# Patient Record
Sex: Female | Born: 1935 | Race: Black or African American | Hispanic: No | Marital: Single
Health system: Southern US, Community
[De-identification: ages and names within clinical notes are randomized; demographics above are authoritative.]

---

## 2020-01-17 ENCOUNTER — Encounter (HOSPITAL_BASED_OUTPATIENT_CLINIC_OR_DEPARTMENT_OTHER): Payer: Medicare Other | Admitting: Internal Medicine

## 2020-02-10 ENCOUNTER — Encounter (HOSPITAL_BASED_OUTPATIENT_CLINIC_OR_DEPARTMENT_OTHER): Payer: Medicare Other | Attending: Internal Medicine | Admitting: Internal Medicine

## 2020-02-10 DIAGNOSIS — E1151 Type 2 diabetes mellitus with diabetic peripheral angiopathy without gangrene: Secondary | ICD-10-CM | POA: Diagnosis not present

## 2020-02-10 DIAGNOSIS — E11621 Type 2 diabetes mellitus with foot ulcer: Secondary | ICD-10-CM | POA: Diagnosis not present

## 2020-02-10 DIAGNOSIS — E1122 Type 2 diabetes mellitus with diabetic chronic kidney disease: Secondary | ICD-10-CM | POA: Insufficient documentation

## 2020-02-10 DIAGNOSIS — N183 Chronic kidney disease, stage 3 unspecified: Secondary | ICD-10-CM | POA: Insufficient documentation

## 2020-02-10 DIAGNOSIS — L97423 Non-pressure chronic ulcer of left heel and midfoot with necrosis of muscle: Secondary | ICD-10-CM | POA: Diagnosis not present

## 2020-02-10 DIAGNOSIS — Z8249 Family history of ischemic heart disease and other diseases of the circulatory system: Secondary | ICD-10-CM | POA: Insufficient documentation

## 2020-02-10 DIAGNOSIS — I129 Hypertensive chronic kidney disease with stage 1 through stage 4 chronic kidney disease, or unspecified chronic kidney disease: Secondary | ICD-10-CM | POA: Diagnosis not present

## 2020-02-10 DIAGNOSIS — Z87891 Personal history of nicotine dependence: Secondary | ICD-10-CM | POA: Diagnosis not present

## 2020-02-10 NOTE — Progress Notes (Signed)
Rebecca, Walsh ([id number]) Visit Report for 02/10/2020 Abuse/Suicide Risk Screen Details Patient Name: Date of Service: Rebecca Walsh 02/10/2020 9:00 A M Medical Record Number: [id number] Patient Account Number: [id number] Date of Birth/Sex: Treating RN: November 19, 1925 (86 y.o. Female) Yevonne Pax Primary Care Rasha Ibe: Other Clinician: Referring Keiarra Charon: Treating Zaneta Lightcap/Extender: Cindee Lame in Treatment: 0 Abuse/Suicide Risk Screen Items Answer ABUSE RISK SCREEN: Has anyone close to you tried to hurt or harm you recentlyo No Do you feel uncomfortable with anyone in your familyo No Has anyone forced you do things that you didnt want to doo No Electronic Signature(s) Signed: 02/10/2020 4:51:09 PM By: Yevonne Pax RN Entered By: Yevonne Pax on 02/10/2020 09:39:46 -------------------------------------------------------------------------------- Activities of Daily Living Details Patient Name: Date of Service: Rebecca Walsh 02/10/2020 9:00 A M Medical Record Number: [id number] Patient Account Number: [id number] Date of Birth/Sex: Treating RN: [birth year] (86 y.o. Female) Yevonne Pax Primary Care Tamyah Cutbirth: Other Clinician: Referring Sherece Gambrill: Treating Kaydince Towles/Extender: Cindee Lame in Treatment: 0 Activities of Daily Living Items Answer Activities of Daily Living (Please select one for each item) Drive Automobile Not Able T Medications ake Not Able Use T elephone Need Assistance Care for Appearance Need Assistance Use T oilet Need Assistance Bath / Shower Need Assistance Dress Self Need Assistance Feed Self Need Assistance Walk Completely Able Get In / Out Bed Need Assistance Housework Not Able Prepare Meals Not Able Handle Money Not Able Shop for Self Not Able Electronic Signature(s) Signed: 02/10/2020 4:51:09 PM By: Yevonne Pax RN Entered By: Yevonne Pax on 02/10/2020  09:42:31 -------------------------------------------------------------------------------- Education Screening Details Patient Name: Date of Service: Rebecca Walsh 02/10/2020 9:00 A M Medical Record Number: [id number] Patient Account Number: [id number] Date of Birth/Sex: Treating RN: 29-Dec-1925 (86 y.o. Female) Yevonne Pax Primary Care Shelby Peltz: Other Clinician: Referring Tony Granquist: Treating Allien Melberg/Extender: Cindee Lame in Treatment: 0 Primary Learner Assessed: Patient Learning Preferences/Education Level/Primary Language Learning Preference: Explanation Highest Education Level: High School Preferred Language: English Cognitive Barrier Language Barrier: No Translator Needed: No Memory Deficit: No Emotional Barrier: No Cultural/Religious Beliefs Affecting Medical Care: No Physical Barrier Impaired Vision: No Impaired Hearing: No Decreased Hand dexterity: No Knowledge/Comprehension Knowledge Level: Medium Comprehension Level: High Ability to understand written instructions: High Ability to understand verbal instructions: High Motivation Anxiety Level: Anxious Cooperation: Cooperative Education Importance: Acknowledges Need Interest in Health Problems: Asks Questions Perception: Coherent Willingness to Engage in Self-Management High Activities: Readiness to Engage in Self-Management High Activities: Electronic Signature(s) Signed: 02/10/2020 4:51:09 PM By: Yevonne Pax RN Entered By: Yevonne Pax on 02/10/2020 09:45:32 -------------------------------------------------------------------------------- Fall Risk Assessment Details Patient Name: Date of Service: Rebecca Walsh 02/10/2020 9:00 A M Medical Record Number: [id number] Patient Account Number: [id number] Date of Birth/Sex: Treating RN: Mar 28, 1926 (86 y.o. Female) Epps, Carrie Primary Care Eldra Word: Other Clinician: Referring Waver Dibiasio: Treating Nyellie Yetter/Extender: Cindee Lame in Treatment: 0 Fall Risk Assessment Items Have you had 2 or more falls in the last 12 monthso 0 No Have you had any fall that resulted in injury in the last 12 monthso 0 No FALLS RISK SCREEN History of falling - immediate or within 3 months 0 No Secondary diagnosis (Do you have 2 or more medical diagnoseso) 0 No Ambulatory aid None/bed rest/wheelchair/nurse 0 No Crutches/cane/walker 0 No Furniture 0 No Intravenous therapy Access/Saline/Heparin Lock 0 No Gait/Transferring Normal/ bed rest/ wheelchair 0 No Weak (short steps with or without shuffle, stooped but able to lift head while walking,  may seek 0 No support from furniture) Impaired (short steps with shuffle, may have difficulty arising from chair, head down, impaired 0 No balance) Mental Status Oriented to own ability 0 No Electronic Signature(s) Signed: 02/10/2020 4:51:09 PM By: Yevonne Pax RN Entered By: Yevonne Pax on 02/10/2020 09:45:41 -------------------------------------------------------------------------------- Foot Assessment Details Patient Name: Date of Service: Rebecca Walsh 02/10/2020 9:00 A M Medical Record Number: [id number] Patient Account Number: [id number] Date of Birth/Sex: Treating RN: 11-Feb-1926 (86 y.o. Female) Yevonne Pax Primary Care Edison Wollschlager: Other Clinician: Referring Ky Rumple: Treating Kianni Lheureux/Extender: Cindee Lame in Treatment: 0 Foot Assessment Items Site Locations + = Sensation present, - = Sensation absent, C = Callus, U = Ulcer R = Redness, W = Warmth, M = Maceration, PU = Pre-ulcerative lesion F = Fissure, S = Swelling, D = Dryness Assessment Right: Left: Other Deformity: No No Prior Foot Ulcer: No No Prior Amputation: No No Charcot Joint: No No Ambulatory Status: Ambulatory Without Help Gait: Unsteady Electronic Signature(s) Signed: 02/10/2020 4:51:09 PM By: Yevonne Pax RN Entered By: Yevonne Pax on 02/10/2020  09:51:32 -------------------------------------------------------------------------------- Nutrition Risk Screening Details Patient Name: Date of Service: Rebecca Walsh 02/10/2020 9:00 A M Medical Record Number: [id number] Patient Account Number: [id number] Date of Birth/Sex: Treating RN: [birth year] (86 y.o. Female) Epps, Carrie Primary Care Massie Cogliano: Other Clinician: Referring Istvan Behar: Treating Harish Bram/Extender: Cindee Lame in Treatment: 0 Height (in): 66 Weight (lbs): 140 Body Mass Index (BMI): 22.6 Nutrition Risk Screening Items Score Screening NUTRITION RISK SCREEN: I have an illness or condition that made me change the kind and/or amount of food I eat 0 No I eat fewer than two meals per day 0 No I eat few fruits and vegetables, or milk products 0 No I have three or more drinks of beer, liquor or wine almost every day 0 No I have tooth or mouth problems that make it hard for me to eat 0 No I don't always have enough money to buy the food I need 0 No I eat alone most of the time 0 No I take three or more different prescribed or over-the-counter drugs a day 1 Yes Without wanting to, I have lost or gained 10 pounds in the last six months 0 No I am not always physically able to shop, cook and/or feed myself 2 Yes Nutrition Protocols Good Risk Protocol Moderate Risk Protocol 0 Provide education on nutrition High Risk Proctocol Risk Level: Moderate Risk Score: 3 Electronic Signature(s) Signed: 02/10/2020 4:51:09 PM By: Yevonne Pax RN Entered By: Yevonne Pax on 02/10/2020 09:45:58

## 2020-02-12 NOTE — Progress Notes (Signed)
Rebecca Walsh, Rebecca Walsh ([id number]) Visit Report for 02/10/2020 Chief Complaint Document Details Patient Name: Date of Service: [place] Rebecca Walsh, CEO [place] 02/10/2020 9:00 A M Medical Record Number: [id number] Patient Account Number: [id number] Date of Birth/Sex: Treating RN: 13-Sep-1925 (86 y.o. Female) Primary Care Provider: Other Clinician: Referring Provider: Treating Provider/Extender: Posey Prontoobson, Ronnell Makarewicz Walsh, Rebecca CrateEmily Walsh in Treatment: 0 Information Obtained from: Patient Chief Complaint 02/10/2020; this patient arrives accompanied by her son and daughter-in-law for review of wounds on the left calcaneus Electronic Signature(s) Signed: 02/12/2020 6:30:39 AM By: Rebecca Najjarobson, Tywanda Rice MD Entered By: Rebecca Najjarobson, Rebecca Walsh on 02/10/2020 10:52:33 -------------------------------------------------------------------------------- Debridement Details Patient Name: Date of Service: [place] Rebecca AmassRTIN, CEO [place] 02/10/2020 9:00 A M Medical Record Number: [id number] Patient Account Number: [id number] Date of Birth/Sex: Treating RN: 13-Sep-1925 (86 y.o. Female) Primary Care Provider: Other Clinician: Referring Provider: Treating Provider/Extender: Rebecca Walsh, Rebecca Walsh Walsh, Rebecca Walsh in Treatment: 0 Debridement Performed for Assessment: Wound #1 Left Calcaneus Performed By: Physician Rebecca Walsh, Rebecca Purdon G., MD Debridement Type: Debridement Severity of Tissue Pre Debridement: Fat layer exposed Level of Consciousness (Pre-procedure): Awake and Alert Pre-procedure Verification/Time Out Yes - 10:22 Taken: Start Time: 10:22 Pain Control: Other : benzocaine, 20% T Area Debrided (L x W): otal 2.5 (cm) x 3.2 (cm) = 8 (cm) Tissue and other material debrided: Muscle, Slough, Subcutaneous, Slough Level: Skin/Subcutaneous Tissue/Muscle Debridement Description: Excisional Instrument: Curette Bleeding: Moderate Hemostasis Achieved: Pressure End Time: 10:23 Procedural Pain: 0 Post Procedural Pain: 0 Response to Treatment: Procedure was tolerated  well Level of Consciousness (Post- Awake and Alert procedure): Post Debridement Measurements of Total Wound Length: (cm) 2.5 Width: (cm) 3.2 Depth: (cm) 1.2 Volume: (cm) 7.54 Character of Wound/Ulcer Post Debridement: Requires Further Debridement Severity of Tissue Post Debridement: Fat layer exposed Post Procedure Diagnosis Same as Pre-procedure Electronic Signature(s) Signed: 02/12/2020 6:30:39 AM By: Rebecca Najjarobson, Rebecca Frampton MD Entered By: Rebecca Najjarobson, Rebecca Walsh on 02/10/2020 10:52:02 -------------------------------------------------------------------------------- HPI Details Patient Name: Date of Service: [place] Rebecca AmassRTIN, CEO [place] 02/10/2020 9:00 A M Medical Record Number: [id number] Patient Account Number: [id number] Date of Birth/Sex: Treating RN: 13-Sep-1925 (86 y.o. Female) Primary Care Provider: Other Clinician: Referring Provider: Treating Provider/Extender: Rebecca Walsh, Rebecca Walsh, Rebecca Walsh in Treatment: 0 History of Present Illness HPI Description: ADMISSION 02/10/2020 This is a 86 year old woman who lives in [facility] which is an assisted living [place]. She has home health which is surprising since she has [place] healthcare Medicaid. Nevertheless she has had 4767-month history of a substantial wound on the left heel. This is in the setting of type 2 diabetes. From what I can tell they have been using Santyl on the wound bed with [facility] changing the dressing 3 times a week. She has also developed more superficial areas on the posterior and lateral part of the heel. Apparently remotely the patient had multiple toes removed on both feet secondary to infection. She is up walking active. I could see a culture [facility] on [facility]/16 that showed 2+ Proteus she apparently has received a course of Keflex. They are using a shoe for her with the heel cut out in the back which is appropriate for this reason I did not see the need to give her a heel offloading shoe. Past medical  history; type 2 diabetes, parotid mass, carotid artery stenosis, pressure ulcer of the left heel, hypertension, chronic renal failure stage III We could not obtain a Doppler signal in her foot therefore we could not measure ABIs at the dorsalis pedis or posterior tibial on the left Electronic Signature(s) Signed: 02/12/2020  6:30:39 AM By: Rebecca Najjar MD Entered By: Rebecca Walsh on 02/10/2020 10:55:01 -------------------------------------------------------------------------------- Physical Exam Details Patient Name: Date of Service: [name] [place] 02/10/2020 9:00 A M Medical Record Number: [id number] Patient Account Number: [id number] Date of Birth/Sex: Treating RN: [birth year] (86 y.o. Female) Primary Care Provider: Other Clinician: Referring Provider: Treating Provider/Extender: Rebecca Rebecca Walsh in Treatment: 0 Constitutional Sitting or standing Blood Pressure is within target range for patient.. Pulse regular and within target range for patient.Marland Kitchen Respirations regular, non-labored and within target range.. Temperature is normal and within the target range for the patient.Marland Kitchen Appears in no distress. Cardiovascular Could not feel femoral or popliteal pulses on the left. I could feel a faint posterior tibial pulse on the left but not the right. No dorsalis pedis pulses palpable. Musculoskeletal The patient has had multiple toes missing including the fifth first and second toes on the left. There are multiple toes missing on the right as well. Psychiatric Under about cognition. No overt depression. Notes Wound exam; the major areas on the medial part of the left heel. Deep punched out area with a completely necrotic surface. Using a #5 curette aggressive debridement dipping down into the muscle layer of the heel. I was able to get the healthy red bleeding tissue but a considerable amount of debridement was done. There is no evidence of surrounding infection the wound did  not probe to bone Posteriorly and laterally more superficial areas with simple loss of skin and Electronic Signature(s) Signed: 02/12/2020 6:30:39 AM By: Rebecca Najjar MD Entered By: Rebecca Walsh on 02/10/2020 10:58:33 -------------------------------------------------------------------------------- Physician Orders Details Patient Name: Date of Service: [name] [place] 02/10/2020 9:00 A M Medical Record Number: [id number] Patient Account Number: [id number] Date of Birth/Sex: Treating RN: [birth year] (86 y.o. Female) Rebecca Walsh Primary Care Provider: Other Clinician: Referring Provider: Treating Provider/Extender: Rebecca Rebecca Walsh in Treatment: 0 Verbal / Phone Orders: No Diagnosis Coding Follow-up Appointments Return Appointment in 2 Walsh. Dressing Change Frequency Wound #1 Left Calcaneus Change Dressing every other day. Wound #2 Left,Lateral Calcaneus Change Dressing every other day. Wound Cleansing Wound #1 Left Calcaneus Clean wound with Wound Cleanser Wound #2 Left,Lateral Calcaneus Clean wound with Wound Cleanser Primary Wound Dressing Wound #1 Left Calcaneus Iodoflex Wound #2 Left,Lateral Calcaneus Iodoflex Secondary Dressing Wound #1 Left Calcaneus Kerlix/Rolled Gauze Dry Gauze Heel Cup Wound #2 Left,Lateral Calcaneus Kerlix/Rolled Gauze Dry Gauze Heel Cup Home Health Continue Home Health skilled nursing for wound care. - [facility] rterial Studies- Bilateral - Bilateral, Including ABI's and TBI's, non-healing wound to left heel [facility]) A Electronic Signature(s) Signed: 02/10/2020 5:26:06 PM By: Rebecca Walsh Signed: 02/12/2020 6:30:39 AM By: Rebecca Najjar MD Entered By: Rebecca Walsh on 02/10/2020 10:28:12 Prescription 02/10/2020 -------------------------------------------------------------------------------- Candi Leash MD Patient Name: Provider: 1925-08-09  [id number] Date of Birth: NPI#: Female [id number] Sex: DEA #: [id number] Phone #: License #: [facility] Patient Address: [address] [place] [address] [place], [name] [address] [place], [name] [address] [phone] Allergies No Known Allergies Provider's Orders rterial Studies- Bilateral - Bilateral, Including ABI's and TBI's, non-healing wound to left heel [facility]) A Hand Signature: Date(s): Electronic Signature(s) Signed: 02/10/2020 5:26:06 PM By: Rebecca Walsh Signed: 02/12/2020 6:30:39 AM By: Rebecca Najjar MD Entered By: Rebecca Walsh on 02/10/2020 10:28:13 -------------------------------------------------------------------------------- Problem List Details Patient Name: Date of Service: [name] [place] 02/10/2020 9:00 A M Medical Record  Number: [id number] Patient Account Number: [id number] Date of Birth/Sex: Treating RN: 11-02-1925 (86 y.o. 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 02/10/2020 No Yes L97.421 Non-pressure chronic ulcer of left heel and midfoot limited to breakdown of 02/10/2020 No Yes skin E11.51 Type 2 diabetes mellitus with diabetic peripheral angiopathy without gangrene 02/10/2020 No Yes E11.40 Type 2 diabetes mellitus with diabetic neuropathy, unspecified 02/10/2020 No Yes Inactive Problems ICD-10 Code Description Active Date Inactive Date L97.123 Non-pressure chronic ulcer of left thigh with necrosis of muscle 02/10/2020 02/10/2020 Resolved Problems Electronic Signature(s) Signed: 02/12/2020 6:30:39 AM By: Rebecca Najjar MD Entered  By: Rebecca Walsh on 02/10/2020 10:40:20 -------------------------------------------------------------------------------- Progress Note Details Patient Name: Date of Service: [name] [place] 02/10/2020 9:00 A M Medical Record Number: [id number] Patient Account Number: [id number] Date of Birth/Sex: Treating RN: 31-Aug-1925 (86 y.o. Female) Primary Care Provider: Other Clinician: Referring Provider: Treating Provider/Extender: Posey Pronto, Rebecca Walsh in Treatment: 0 Subjective Chief Complaint Information obtained from Patient 02/10/2020; this patient arrives accompanied by her son and daughter-in-law for review of wounds on the left calcaneus History of Present Illness (HPI) ADMISSION 02/10/2020 This is a 86 year old woman who lives in [facility] which is an assisted living in [place]. She has home health which is surprising since she has [place] healthcare Medicaid. Nevertheless she has had 9-month history of a substantial wound on the left heel. This is in the setting of type 2 diabetes. From what I can tell they have been using Santyl on the wound bed [facility] changing the dressing 3 times a week. She has also developed more superficial areas on the posterior and lateral part of the heel. Apparently remotely the patient had multiple toes removed on both feet secondary to infection. She is up walking active. I could see a culture from [facility] on 9/16 that showed 2+ Proteus she apparently has received a course of Keflex. They are using a shoe for her with the heel cut out in the back which is appropriate for this reason I did not see the need to give her a heel offloading shoe. Past medical history; type 2 diabetes, parotid mass, carotid artery stenosis, pressure ulcer of the left heel, hypertension, chronic renal failure stage III We could not obtain a Doppler signal in her foot therefore we could not measure ABIs at the dorsalis pedis or  posterior tibial on the left Patient History Information obtained from Patient. Allergies No Known Allergies Family History Hypertension - Child, No family history of Cancer, Diabetes, Heart Disease, Hereditary Spherocytosis, Kidney Disease, Seizures, Stroke, Thyroid Problems, Tuberculosis. Social History Former smoker, Marital Status - Widowed, Alcohol Use - Never, Drug Use - No History, Caffeine Use - Daily. Medical History Eyes Denies history of Cataracts, Glaucoma, Optic Neuritis Ear/Nose/Mouth/Throat Denies history of Chronic sinus problems/congestion, Middle ear problems Hematologic/Lymphatic Denies history of Anemia, Hemophilia, Human Immunodeficiency Virus, Lymphedema, Sickle Cell Disease Respiratory Denies history of Aspiration, Asthma, Chronic Obstructive Pulmonary Disease (COPD), Pneumothorax, Sleep Apnea, Tuberculosis Cardiovascular Patient has history of Hypertension Denies history of Angina, Arrhythmia, Congestive Heart Failure, Coronary Artery Disease, Deep Vein Thrombosis, Hypotension, Myocardial Infarction, Peripheral Arterial Disease, Peripheral Venous Disease, Phlebitis, Vasculitis Gastrointestinal Denies history of Cirrhosis , Colitis, Crohnoos, Hepatitis A, Hepatitis  B, Hepatitis C Endocrine Patient has history of Type II Diabetes Denies history of Type I Diabetes Genitourinary Denies history of End Stage Renal Disease Immunological Denies history of Lupus Erythematosus, Raynaudoos, Scleroderma Integumentary (Skin) Denies history of History of Burn Musculoskeletal Denies history of Gout, Rheumatoid Arthritis, Osteoarthritis, Osteomyelitis Neurologic Denies history of Dementia, Neuropathy, Quadriplegia, Paraplegia, Seizure Disorder Oncologic Denies history of Received Chemotherapy, Received Radiation Psychiatric Denies history of Anorexia/bulimia, Confinement Anxiety Patient is treated with Controlled Diet. Blood sugar is not tested. Review of Systems  (ROS) Constitutional Symptoms (General Health) Denies complaints or symptoms of Fatigue, Fever, Chills, Marked Weight Change. Eyes Denies complaints or symptoms of Dry Eyes, Vision Changes, Glasses / Contacts. Ear/Nose/Mouth/Throat Denies complaints or symptoms of Chronic sinus problems or rhinitis. Respiratory Denies complaints or symptoms of Chronic or frequent coughs, Shortness of Breath. Cardiovascular Denies complaints or symptoms of Chest pain. Gastrointestinal Denies complaints or symptoms of Frequent diarrhea, Nausea, Vomiting. Endocrine Denies complaints or symptoms of Heat/cold intolerance. Genitourinary Denies complaints or symptoms of Frequent urination. Integumentary (Skin) Complains or has symptoms of Wounds. Musculoskeletal Denies complaints or symptoms of Muscle Pain, Muscle Weakness. Neurologic Denies complaints or symptoms of Numbness/parasthesias. Psychiatric Denies complaints or symptoms of Claustrophobia, Suicidal. Objective Constitutional Sitting or standing Blood Pressure is within target range for patient.. Pulse regular and within target range for patient.Marland Kitchen Respirations regular, non-labored and within target range.. Temperature is normal and within the target range for the patient.Marland Kitchen Appears in no distress. Vitals Time Taken: 9:29 AM, Height: 66 in, Source: Stated, Weight: 140 lbs, Source: Stated, BMI: 22.6, Temperature: 98.6 F, Pulse: 80 bpm, Respiratory Rate: 18 breaths/min, Blood Pressure: 132/71 mmHg. Cardiovascular Could not feel femoral or popliteal pulses on the left. I could feel a faint posterior tibial pulse on the left but not the right. No dorsalis pedis pulses palpable. Musculoskeletal The patient has had multiple toes missing including the fifth first and second toes on the left. There are multiple toes missing on the right as well. Psychiatric Under about cognition. No overt depression. General Notes: Wound exam; the major areas on the  medial part of the left heel. Deep punched out area with a completely necrotic surface. Using a #5 curette aggressive debridement dipping down into the muscle layer of the heel. I was able to get the healthy red bleeding tissue but a considerable amount of debridement was done. There is no evidence of surrounding infection the wound did not probe to bone ooPosteriorly and laterally more superficial areas with simple loss of skin and Integumentary (Hair, Skin) Wound #1 status is Open. Original cause of wound was Gradually Appeared. The wound is located on the Left Calcaneus. The wound measures 2.5cm length x 3.2cm width x 1.2cm depth; 6.283cm^2 area and 7.54cm^3 volume. There is Fat Layer (Subcutaneous Tissue) exposed. There is no tunneling or undermining noted. There is a medium amount of serosanguineous drainage noted. There is no granulation within the wound bed. There is a large (67-100%) amount of necrotic tissue within the wound bed including Adherent Slough. Wound #2 status is Open. Original cause of wound was Gradually Appeared. The wound is located on the Left,Lateral Calcaneus. The wound measures 1.2cm length x 1.3cm width x 0.1cm depth; 1.225cm^2 area and 0.123cm^3 volume. There is Fat Layer (Subcutaneous Tissue) exposed. There is no tunneling or undermining noted. There is a medium amount of serosanguineous drainage noted. There is small (1-33%) pink granulation within the wound bed. There is a large (67-100%) amount of necrotic tissue within the  wound bed including Adherent Slough. 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 There was a Excisional Skin/Subcutaneous Tissue/Muscle Debridement with a total area of 8 sq cm performed by Rebecca Caul., MD. With the following instrument(s): Curette Material removed includes Muscle, Subcutaneous Tissue, and Slough after achieving pain control using Other (benzocaine, 20%). No specimens were taken. A time out was conducted at 10:22, prior to the start of the procedure. A Moderate amount of bleeding was controlled with Pressure. The procedure was tolerated well with a pain level of 0 throughout and a pain level of 0 following the procedure. Post Debridement Measurements: 2.5cm length x 3.2cm width x 1.2cm depth; 7.54cm^3 volume. Character of Wound/Ulcer Post Debridement requires further debridement. Severity of Tissue Post Debridement is: Fat layer exposed. Post procedure Diagnosis Wound #1: Same as Pre-Procedure Plan Follow-up Appointments: Return Appointment in 2 Walsh. Dressing Change Frequency: Wound #1 Left Calcaneus: Change Dressing every other day. Wound #2 Left,Lateral Calcaneus: Change Dressing every other day. Wound Cleansing: Wound #1 Left Calcaneus: Clean wound with Wound Cleanser Wound #2 Left,Lateral Calcaneus: Clean wound with Wound Cleanser Primary Wound Dressing: Wound #1 Left Calcaneus: Iodoflex Wound #2 Left,Lateral Calcaneus: Iodoflex Secondary Dressing: Wound #1 Left Calcaneus: Kerlix/Rolled Gauze Dry Gauze Heel Cup Wound #2 Left,Lateral Calcaneus: Kerlix/Rolled Gauze Dry Gauze Heel Cup Home Health: Continue Home Health skilled nursing for wound care. - [facility] ordered were: Arterial Studies- Bilateral - Bilateral, Including ABI's and TBI's, non-healing wound to left heel [facility]) 1. Santyl is a dressing that should be changed every day not 3  times a week. I therefore change the dressing on the heel to Iodoflex. This can be changed 3 times a week 2. We will use a heel cup I do not think the superficial areas that she has need a more specific dressing 3. I think this woman probably has significant PAD. I have ordered arterial studies not just for healing this wound but for determination of issues going forward. 4. She has diabetic peripheral neuropathy or I think she would be in more pain than she appears to be in which is really not a lot 5. 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 I am not sure what the issue was but they were apparently done remotely a more than 10 years ago I spent 35 minutes in review of this patient's past medical history, face-to-face evaluation and preparation of this record Electronic Signature(s) Signed: 02/12/2020 6:30:39 AM By: Rebecca Najjar MD Entered By: Rebecca Walsh on 02/10/2020 11:00:57 -------------------------------------------------------------------------------- HxROS Details Patient Name: Date of Service: [name] [place] 02/10/2020 9:00 A M Medical Record Number: [id number] Patient Account Number: [id number] Date of Birth/Sex: Treating RN: 1925-08-30 (86 y.o. Female) Rebecca Walsh Primary Care Provider: Other Clinician: Referring Provider: Treating Provider/Extender: Rebecca Rebecca Walsh in Treatment: 0 Information Obtained From Patient Constitutional Symptoms (General Health) Complaints and Symptoms: Negative for: Fatigue; Fever; Chills; Marked Weight Change Eyes Complaints and Symptoms: Negative for: Dry Eyes; Vision Changes; Glasses / Contacts Medical History:  Negative for: Cataracts; Glaucoma; Optic Neuritis Ear/Nose/Mouth/Throat Complaints and Symptoms: Negative for: Chronic sinus problems or rhinitis Medical History: Negative for: Chronic sinus problems/congestion; Middle ear problems Respiratory Complaints and Symptoms: Negative for:  Chronic or frequent coughs; Shortness of Breath Medical History: Negative for: Aspiration; Asthma; Chronic Obstructive Pulmonary Disease (COPD); Pneumothorax; Sleep Apnea; Tuberculosis Cardiovascular Complaints and Symptoms: Negative for: Chest pain Medical History: Positive for: Hypertension Negative for: Angina; Arrhythmia; Congestive Heart Failure; Coronary Artery Disease; Deep Vein Thrombosis; Hypotension; Myocardial Infarction; Peripheral Arterial Disease; Peripheral Venous Disease; Phlebitis; Vasculitis Gastrointestinal Complaints and Symptoms: Negative for: Frequent diarrhea; Nausea; Vomiting Medical History: Negative for: Cirrhosis ; Colitis; Crohns; Hepatitis A; Hepatitis B; Hepatitis C Endocrine Complaints and Symptoms: Negative for: Heat/cold intolerance Medical History: Positive for: Type II Diabetes Negative for: Type I Diabetes Treated with: Diet Blood sugar tested every day: No Genitourinary Complaints and Symptoms: Negative for: Frequent urination Medical History: Negative for: End Stage Renal Disease Integumentary (Skin) Complaints and Symptoms: Positive for: Wounds Medical History: Negative for: History of Burn Musculoskeletal Complaints and Symptoms: Negative for: Muscle Pain; Muscle Weakness Medical History: Negative for: Gout; Rheumatoid Arthritis; Osteoarthritis; Osteomyelitis Neurologic Complaints and Symptoms: Negative for: Numbness/parasthesias Medical History: Negative for: Dementia; Neuropathy; Quadriplegia; Paraplegia; Seizure Disorder Psychiatric Complaints and Symptoms: Negative for: Claustrophobia; Suicidal Medical History: Negative for: Anorexia/bulimia; Confinement Anxiety Hematologic/Lymphatic Medical History: Negative for: Anemia; Hemophilia; Human Immunodeficiency Virus; Lymphedema; Sickle Cell Disease Immunological Medical History: Negative for: Lupus Erythematosus; Raynauds; Scleroderma Oncologic Medical History: Negative  for: Received Chemotherapy; Received Radiation Immunizations Pneumococcal Vaccine: Received Pneumococcal Vaccination: No Implantable Devices None Family and Social History Cancer: No; Diabetes: No; Heart Disease: No; Hereditary Spherocytosis: No; Hypertension: Yes - Child; Kidney Disease: No; Seizures: No; Stroke: No; Thyroid Problems: No; Tuberculosis: No; Former smoker; Marital Status - Widowed; Alcohol Use: Never; Drug Use: No History; Caffeine Use: Daily; Financial Concerns: No; Food, Clothing or Shelter Needs: No; Support System Lacking: No; Transportation Concerns: No Electronic Signature(s) Signed: 02/10/2020 4:51:09 PM By: Rebecca Pax RN Signed: 02/12/2020 6:30:39 AM By: Rebecca Najjar MD Entered By: Rebecca Walsh on 02/10/2020 09:39:21 -------------------------------------------------------------------------------- SuperBill Details Patient Name: Date of Service: MA Rebecca Balo 02/10/2020 Medical Record Number: [id number] Patient Account Number: [id number] Date of Birth/Sex: Treating RN: [birth year] (86 y.o. 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 L97.421 Non-pressure chronic ulcer of left heel and midfoot limited to breakdown of skin E11.51 Type 2 diabetes mellitus with diabetic peripheral angiopathy without gangrene E11.40 Type 2 diabetes mellitus with diabetic neuropathy, unspecified Facility Procedures CPT4 Code: 40981191 Description: 99213 - WOUND CARE VISIT-LEV 3 EST PT Modifier: 25 Quantity: 1 CPT4 Code: 47829562 Description: 11043 - DEB MUSC/FASCIA 20 SQ CM/< ICD-10 Diagnosis Description L97.423 Non-pressure chronic ulcer of left heel and midfoot with necrosis of  muscle Modifier: Quantity: 1 Physician Procedures : CPT4 Code Description Modifier [id number] WC PHYS LEVEL 3 NEW PT 25 ICD-10 Diagnosis Description E11.621 Type 2 diabetes mellitus with foot ulcer 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

## 2020-02-12 NOTE — Progress Notes (Signed)
Rebecca, Walsh ([id number]) Visit Report for 02/10/2020 Allergy List Details Patient Name: Date of Service: Rebecca Walsh 02/10/2020 9:00 A M Medical Record Number: [id number] Patient Account Number: [id number] Date of Birth/Sex: Treating RN: 06-26-1925 (86 y.o. Female) Rebecca Walsh Primary Care Sherwin Hollingshed: Other Clinician: Referring Ky Moskowitz: Treating Abbegale Stehle/Extender: Cindee Lame in Treatment: 0 Allergies Active Allergies No Known Allergies Allergy Notes Electronic Signature(s) Signed: 02/10/2020 4:51:09 PM By: Rebecca Pax RN Entered By: Rebecca Walsh on 02/10/2020 09:32:36 -------------------------------------------------------------------------------- Arrival Information Details Patient Name: Date of Service: [name] 02/10/2020 9:00 A M Medical Record Number: [id number] Patient Account Number: [id number] Date of Birth/Sex: Treating RN: 02-18-1926 (86 y.o. Female) Rebecca Walsh Primary Care Breckon Reeves: Other Clinician: Referring Nkechi Linehan: Treating Ricke Kimoto/Extender: Cindee Lame in Treatment: 0 Visit Information Patient Arrived: Cloyde Reams Time: 09:17 Accompanied By: family friend Transfer Assistance: None Patient Identification Verified: Yes Secondary Verification Process Completed: Yes Patient Requires Transmission-Based Precautions: No Patient Has Alerts: No Electronic Signature(s) Signed: 02/10/2020 4:51:09 PM By: Rebecca Pax RN Entered By: Rebecca Walsh on 02/10/2020 09:29:24 -------------------------------------------------------------------------------- Clinic Level of Care Assessment Details Patient Name: Date of Service: [name] 02/10/2020 9:00 A M Medical Record Number: [id number] Patient Account Number: [id number] Date of Birth/Sex: Treating RN: [birth year] (86 y.o. Female) Dwiggins, Rebecca Walsh Primary Care Tameika Heckmann: Other Clinician: Referring Copeland Lapier: Treating Lakeem Rozo/Extender: Cindee Lame in Treatment: 0 Clinic Level of Care Assessment Items TOOL 1 Quantity Score X- 1 0 Use when EandM and Procedure is performed on INITIAL visit ASSESSMENTS - Nursing Assessment / Reassessment X- 1 20 General Physical Exam (combine w/ comprehensive assessment (listed just below) when performed on new pt. evals) X- 1 25 Comprehensive Assessment (HX, ROS, Risk Assessments, Wounds Hx, etc.) ASSESSMENTS - Wound and Skin Assessment / Reassessment []  - 0 Dermatologic / Skin Assessment (not related to wound area) ASSESSMENTS - Ostomy and/or Continence Assessment and Care []  - 0 Incontinence Assessment and Management []  - 0 Ostomy Care Assessment and Management (repouching, etc.) PROCESS - Coordination of Care X - Simple Patient / Family Education for ongoing care 1 15 []  - 0 Complex (extensive) Patient / Family Education for ongoing care X- 1 10 Staff obtains , Records, T Results / Process Orders est X- 1 10 Staff telephones HHA, Nursing Homes / Clarify orders / etc []  - 0 Routine Transfer to another Facility (non-emergent condition) []  - 0 Routine Hospital Admission (non-emergent condition) X- 1 15 New Admissions / / Ordering NPWT Apligraf, etc. , []  - 0 Emergency Hospital Admission (emergent condition) PROCESS - Special Needs []  - 0 Pediatric / Minor Patient Management []  - 0 Isolation Patient Management []  - 0 Hearing / Language / Visual special needs []  - 0 Assessment of Community assistance (transportation, D/C planning, etc.) []  - 0 Additional assistance / Altered mentation []  - 0 Support Surface(s) Assessment (bed, cushion, seat, etc.) INTERVENTIONS - Miscellaneous []  - 0 External ear exam []  - 0 Patient Transfer (multiple staff / / Similar devices) []  - 0 Simple Staple / Suture removal (25 or less) []  - 0 Complex Staple / Suture removal (26 or more) []  - 0 Hypo/Hyperglycemic Management (do not check if  billed separately) X- 1 15 Ankle / Brachial Index (ABI) - do not check if billed separately Has the patient been seen at the hospital within the last three years: Yes Total Score: 110 Level Of Care: New/Established - Level 3 Electronic Signature(s) Signed: 02/10/2020  5:26:06 PM By: Cherylin Mylar Entered By: Cherylin Mylar on 02/10/2020 10:36:23 -------------------------------------------------------------------------------- Encounter Discharge Information Details Patient Name: Date of Service: [name] 02/10/2020 9:00 A M Medical Record Number: [id number] Patient Account Number: [id number] Date of Birth/Sex: Treating RN: 1925/08/25 (86 y.o. Female) Rebecca Walsh Primary Care Curren Mohrmann: Other Clinician: Referring Shawny Borkowski: Treating Nikiyah Fackler/Extender: Cindee Lame in Treatment: 0 Encounter Discharge Information Items Post Procedure Vitals Discharge Condition: Stable Temperature (F): 98.6 Ambulatory Status: Walker Pulse (bpm): 80 Discharge Destination: Other (Note Required) Respiratory Rate (breaths/min): 18 Telephoned: No Blood Pressure (mmHg): 132/71 Orders Sent: Yes Transportation: Private Auto Accompanied By: friend Schedule Follow-up Appointment: Yes Clinical Summary of Care: Patient Declined Notes assisted living facility Electronic Signature(s) Signed: 02/10/2020 4:39:15 PM By: Rebecca Deed RN, BSN Entered By: Rebecca Walsh on 02/10/2020 10:55:54 -------------------------------------------------------------------------------- Lower Extremity Assessment Details Patient Name: Date of Service: [name] 02/10/2020 9:00 A M Medical Record Number: [id number] Patient Account Number: [id number] Date of Birth/Sex: Treating RN: [birth year] (86 y.o. Female) Rebecca Walsh Primary Care Jarrah Seher: Other Clinician: Referring Francee Setzer: Treating Jamil Armwood/Extender: Cindee Lame in Treatment: 0 Edema  Assessment Assessed: [name]: No] [Right: No] [Left: Edema] [Right: :] Calf Left: Right: Point of Measurement: 40 cm From Medial Instep 31 cm cm Ankle Left: Right: Point of Measurement: 10 cm From Medial Instep 24 cm cm Electronic Signature(s) Signed: 02/10/2020 4:51:09 PM By: Rebecca Pax RN Entered By: Rebecca Walsh on 02/10/2020 10:06:46 -------------------------------------------------------------------------------- Multi Wound Chart Details Patient Name: Date of Service: [name] 02/10/2020 9:00 A M Medical Record Number: [id number] Patient Account Number: [id number] Date of Birth/Sex: Treating RN: Aug 31, 1925 (86 y.o. Female) Primary Care Mareo Portilla: Other Clinician: Referring Camri Molloy: Treating Denali Becvar/Extender: Cindee Lame in Treatment: 0 Vital Signs Height(in): 66 Pulse(bpm): 80 Weight(lbs): 140 Blood Pressure(mmHg): 132/71 Body Mass Index(BMI): 23 Temperature(F): 98.6 Respiratory Rate(breaths/min): 18 Photos: [1:No Photos Left Calcaneus] [2:No Photos Left, Lateral Calcaneus] [N/A:N/A N/A] Wound Location: [1:Gradually Appeared] [2:Gradually Appeared] [N/A:N/A] Wounding Event: [1:Diabetic Wound/Ulcer of the Lower] [2:Diabetic Wound/Ulcer of the Lower] [N/A:N/A] Primary Etiology: [1:Extremity Hypertension, Type II Diabetes] [2:Extremity Hypertension, Type II Diabetes] [N/A:N/A] Comorbid History: [1:10/18/2019] [2:10/18/2019] [N/A:N/A] Date Acquired: [1:0] [2:0] [N/A:N/A] Weeks of Treatment: [1:Open] [2:Open] [N/A:N/A] Wound Status: [1:2.5x3.2x1.2] [2:1.2x1.3x0.1] [N/A:N/A] Measurements L x W x D (cm) [1:6.283] [2:1.225] [N/A:N/A] A (cm) : rea [1:7.54] [2:0.123] [N/A:N/A] Volume (cm) : [1:Grade 2] [2:Grade 2] [N/A:N/A] Classification: [1:Medium] [2:Medium] [N/A:N/A] Exudate A mount: [1:Serosanguineous] [2:Serosanguineous] [N/A:N/A] Exudate Type: [1:red, brown] [2:red, brown] [N/A:N/A] Exudate Color: [1:None Present (0%)] [2:Small (1-33%)]  [N/A:N/A] Granulation A mount: [1:N/A] [2:Pink] [N/A:N/A] Granulation Quality: [1:Large (67-100%)] [2:Large (67-100%)] [N/A:N/A] Necrotic A mount: [1:Fat Layer (Subcutaneous Tissue): Yes Fat Layer (Subcutaneous Tissue): Yes N/A] Exposed Structures: [1:Fascia: No Tendon: No Muscle: No Joint: No Bone: No None] [2:Fascia: No Tendon: No Muscle: No Joint: No Bone: No None] [N/A:N/A] Epithelialization: [1:Debridement - Excisional] [2:N/A] [N/A:N/A] Debridement: Pre-procedure Verification/Time Out 10:22 [2:N/A] [N/A:N/A] Taken: [1:Other] [2:N/A] [N/A:N/A] Pain Control: [1:Subcutaneous, Slough] [2:N/A] [N/A:N/A] Tissue Debrided: [1:Skin/Subcutaneous Tissue] [2:N/A] [N/A:N/A] Level: [1:8] [2:N/A] [N/A:N/A] Debridement A (sq cm): [1:rea Curette] [2:N/A] [N/A:N/A] Instrument: [1:Moderate] [2:N/A] [N/A:N/A] Bleeding: [1:Pressure] [2:N/A] [N/A:N/A] Hemostasis A chieved: [1:0] [2:N/A] [N/A:N/A] Procedural Pain: [1:0] [2:N/A] [N/A:N/A] Post Procedural Pain: [1:Procedure was tolerated well] [2:N/A] [N/A:N/A] Debridement Treatment Response: [1:2.5x3.2x1.2] [2:N/A] [N/A:N/A] Post Debridement Measurements L x W x D (cm) [1:7.54] [2:N/A] [N/A:N/A] Post Debridement Volume: (cm) [1:Debridement] [2:N/A] [N/A:N/A] Treatment Notes Electronic Signature(s) Signed: 02/12/2020 6:30:39 AM By: Baltazar Najjar MD Entered  By: Baltazar Najjar on 02/10/2020 10:51:39 -------------------------------------------------------------------------------- Multi-Disciplinary Care Plan Details Patient Name: Date of Service: [name] 02/10/2020 9:00 A M Medical Record Number: [id number] Patient Account Number: [id number] Date of Birth/Sex: Treating RN: 1925-08-30 (86 y.o. Female) Dwiggins, [facility] Primary Care Jesika Men: Other Clinician: Referring Pius Byrom: Treating Mykayla Brinton/Extender: Cindee Lame in Treatment: 0 Active Inactive Nutrition Nursing Diagnoses: Impaired glucose control: actual or  potential Goals: Patient/caregiver verbalizes understanding of need to maintain therapeutic glucose control per primary care physician Date Initiated: 02/10/2020 Target Resolution Date: 03/09/2020 Goal Status: Active Interventions: Provide education on elevated blood sugars and impact on wound healing Notes: Orientation to the Wound Care Program Nursing Diagnoses: Knowledge deficit related to the wound healing Walsh program Goals: Patient/caregiver will verbalize understanding of the Wound Healing Walsh Program Date Initiated: 02/10/2020 Target Resolution Date: 03/09/2020 Goal Status: Active Interventions: Provide education on orientation to the wound Walsh Notes: Wound/Skin Impairment Nursing Diagnoses: Impaired tissue integrity Goals: Ulcer/skin breakdown will have a volume reduction of 50% by week 8 Date Initiated: 02/10/2020 Target Resolution Date: 03/09/2020 Goal Status: Active Interventions: Provide education on ulcer and skin care Notes: Electronic Signature(s) Signed: 02/10/2020 5:26:06 PM By: Cherylin Mylar Entered By: Cherylin Mylar on 02/10/2020 10:21:02 -------------------------------------------------------------------------------- Pain Assessment Details Patient Name: Date of Service: [name] 02/10/2020 9:00 A M Medical Record Number: [id number] Patient Account Number: [id number] Date of Birth/Sex: Treating RN: [birth year] (86 y.o. Female) Rebecca Walsh Primary Care Francessca Friis: Other Clinician: Referring Nabil Bubolz: Treating Mekisha Bittel/Extender: Cindee Lame in Treatment: 0 Active Problems Location of Pain Severity and Description of Pain Patient Has Paino No Site Locations Pain Management and Medication Current Pain Management: Electronic Signature(s) Signed: 02/10/2020 4:51:09 PM By: Rebecca Pax RN Entered By: Rebecca Walsh on 02/10/2020  10:09:29 -------------------------------------------------------------------------------- Patient/Caregiver Education Details Patient Name: Date of Service: MA RTIN, CEO Walsh 9/24/2021andnbsp9:00 A M Medical Record Number: [id number] Patient Account Number: [id number] Date of Birth/Gender: Treating RN: [birth year] (86 y.o. Female) Cherylin Mylar Primary Care Physician: Other Clinician: Referring Physician: Treating Physician/Extender: Cindee Lame in Treatment: 0 Education Assessment Education Provided To: Patient Education Topics Provided Elevated Blood Sugar/ Impact on Healing: Handouts: Elevated Blood Sugars: How Do They Affect Wound Healing Methods: Explain/Verbal Responses: State content correctly Welcome T [facility]: o Handouts: Welcome T [facility] o Methods: Explain/Verbal Responses: State content correctly Wound/Skin Impairment: Handouts: Caring for Your Ulcer Methods: Explain/Verbal Responses: State content correctly Electronic Signature(s) Signed: 02/10/2020 5:26:06 PM By: Cherylin Mylar Entered By: Cherylin Mylar on 02/10/2020 10:29:03 -------------------------------------------------------------------------------- Wound Assessment Details Patient Name: Date of Service: [name] 02/10/2020 9:00 A M Medical Record Number: [id number] Patient Account Number: [id number] Date of Birth/Sex: Treating RN: Jan 16, 1926 (86 y.o. Female) Epps, Carrie Primary Care Domanique Luckett: Other Clinician: Referring Nur Krasinski: Treating Ashlan Dignan/Extender: Cindee Lame in Treatment: 0 Wound Status Wound Number: 1 Primary Etiology: Diabetic Wound/Ulcer of the Lower Extremity Wound Location: Left Calcaneus Wound Status: Open Wounding Event: Gradually Appeared Comorbid History: Hypertension, Type II Diabetes Date Acquired: 10/18/2019 Weeks Of Treatment: 0 Clustered Wound: No Wound Measurements Length: (cm)  2.5 Width: (cm) 3.2 Depth: (cm) 1.2 Area: (cm) 6.283 Volume: (cm) 7.54 % Reduction in Area: % Reduction in Volume: Epithelialization: None Tunneling: No Undermining: No Wound Description Classification: Grade 2 Exudate Amount: Medium Exudate Type: Serosanguineous Exudate Color: red, brown Foul Odor After Cleansing: No Slough/Fibrino Yes Wound Bed Granulation Amount: None Present (0%) Exposed Structure Necrotic Amount: Large (67-100%)  Fascia Exposed: No Necrotic Quality: Adherent Slough Fat Layer (Subcutaneous Tissue) Exposed: Yes Tendon Exposed: No Muscle Exposed: No Joint Exposed: No Bone Exposed: No Treatment Notes Wound #1 (Left Calcaneus) 3. Primary Dressing Applied Iodoflex 4. Secondary Dressing Dry Gauze Roll Gauze Heel Cup Electronic Signature(s) Signed: 02/10/2020 4:51:09 PM By: Rebecca PaxEpps, Carrie RN Entered By: Rebecca PaxEpps, Carrie on 02/10/2020 10:07:56 -------------------------------------------------------------------------------- Wound Assessment Details Patient Name: Date of Service: Dennard SchaumannMA RTIN, CEO Walsh 02/10/2020 9:00 A M Medical Record Number: [id number] Patient Account Number: [id number] Date of Birth/Sex: Treating RN: 09-15-1925 (86 y.o. Female) Epps, Carrie Primary Care Maizee Reinhold: Other Clinician: Referring Ansen Sayegh: Treating Gitty Osterlund/Extender: Cindee Lameobson, Michael Cline, Emily Weeks in Treatment: 0 Wound Status Wound Number: 2 Primary Etiology: Diabetic Wound/Ulcer of the Lower Extremity Wound Location: Left, Lateral Calcaneus Wound Status: Open Wounding Event: Gradually Appeared Comorbid History: Hypertension, Type II Diabetes Date Acquired: 10/18/2019 Weeks Of Treatment: 0 Clustered Wound: No Wound Measurements Length: (cm) 1.2 Width: (cm) 1.3 Depth: (cm) 0.1 Area: (cm) 1.225 Volume: (cm) 0.123 % Reduction in Area: % Reduction in Volume: Epithelialization: None Tunneling: No Undermining: No Wound Description Classification: Grade 2 Exudate  Amount: Medium Exudate Type: Serosanguineous Exudate Color: red, brown Foul Odor After Cleansing: No Slough/Fibrino Yes Wound Bed Granulation Amount: Small (1-33%) Exposed Structure Granulation Quality: Pink Fascia Exposed: No Necrotic Amount: Large (67-100%) Fat Layer (Subcutaneous Tissue) Exposed: Yes Necrotic Quality: Adherent Slough Tendon Exposed: No Muscle Exposed: No Joint Exposed: No Bone Exposed: No Treatment Notes Wound #2 (Left, Lateral Calcaneus) 3. Primary Dressing Applied Iodoflex 4. Secondary Dressing Dry Gauze Roll Gauze Heel Cup Electronic Signature(s) Signed: 02/10/2020 4:51:09 PM By: Rebecca PaxEpps, Carrie RN Entered By: Rebecca PaxEpps, Carrie on 02/10/2020 10:09:18 -------------------------------------------------------------------------------- Vitals Details Patient Name: Date of Service: MA Rebecca AmassRTIN, CEO Walsh 02/10/2020 9:00 A M Medical Record Number: [id number] Patient Account Number: [id number] Date of Birth/Sex: Treating RN: 09-15-1925 (86 y.o. Female) Epps, Carrie Primary Care Denay Pleitez: Other Clinician: Referring Zylah Elsbernd: Treating Makenleigh Crownover/Extender: Cindee Lameobson, Michael Cline, Emily Weeks in Treatment: 0 Vital Signs Time Taken: 09:29 Temperature (F): 98.6 Height (in): 66 Pulse (bpm): 80 Source: Stated Respiratory Rate (breaths/min): 18 Weight (lbs): 140 Blood Pressure (mmHg): 132/71 Source: Stated Reference Range: 80 - 120 mg / dl Body Mass Index (BMI): 22.6 Electronic Signature(s) Signed: 02/10/2020 4:51:09 PM By: Rebecca PaxEpps, Carrie RN Entered By: Rebecca PaxEpps, Carrie on 02/10/2020 09:32:09

## 2020-02-14 ENCOUNTER — Other Ambulatory Visit: Payer: Self-pay | Admitting: Internal Medicine

## 2020-02-14 DIAGNOSIS — S91302A Unspecified open wound, left foot, initial encounter: Secondary | ICD-10-CM

## 2020-02-23 ENCOUNTER — Other Ambulatory Visit: Payer: Self-pay

## 2020-02-23 ENCOUNTER — Encounter (HOSPITAL_BASED_OUTPATIENT_CLINIC_OR_DEPARTMENT_OTHER): Payer: Medicare Other | Attending: Internal Medicine | Admitting: Internal Medicine

## 2020-02-23 DIAGNOSIS — E1151 Type 2 diabetes mellitus with diabetic peripheral angiopathy without gangrene: Secondary | ICD-10-CM | POA: Diagnosis not present

## 2020-02-23 DIAGNOSIS — E1140 Type 2 diabetes mellitus with diabetic neuropathy, unspecified: Secondary | ICD-10-CM | POA: Insufficient documentation

## 2020-02-23 DIAGNOSIS — L97423 Non-pressure chronic ulcer of left heel and midfoot with necrosis of muscle: Secondary | ICD-10-CM | POA: Diagnosis not present

## 2020-02-23 DIAGNOSIS — E11621 Type 2 diabetes mellitus with foot ulcer: Secondary | ICD-10-CM | POA: Insufficient documentation

## 2020-02-24 NOTE — Progress Notes (Signed)
DEVOIRY, CORRIHER ([id number]) Visit Report for 02/23/2020 Arrival Information Details Patient Name: Date of Service: Rebecca Walsh 02/23/2020 12:45 PM Medical Record Number: [id number] Patient Account Number: [id number] Date of Birth/Sex: Treating RN: [birth year] (86 y.o. Rebecca Walsh, [name] Primary Care Jackquline Branca: Other Clinician: Referring Rebekka Lobello: Treating Fleda Pagel/Extender: Cindee Lame in Treatment: 1 Visit Information History Since Last Visit Added or deleted any medications: No Patient Arrived: Ambulatory Any new allergies or adverse reactions: No Arrival Time: 13:11 Had a fall or experienced change in No Accompanied By: son activities of daily living that may affect Transfer Assistance: None risk of falls: Patient Identification Verified: Yes Signs or symptoms of abuse/neglect since last visito No Secondary Verification Process Completed: Yes Hospitalized since last visit: No Patient Requires Transmission-Based Precautions: No Implantable device outside of the clinic excluding No Patient Has Alerts: No cellular tissue based products placed in the center since last visit: Has Dressing in Place as Prescribed: Yes Pain Present Now: No Electronic Signature(s) Signed: 02/24/2020 11:39:24 AM By: Karl Ito Entered By: Karl Ito on 02/23/2020 13:19:22 -------------------------------------------------------------------------------- Encounter Discharge Information Details Patient Name: Date of Service: [name] 02/23/2020 12:45 PM Medical Record Number: [id number] Patient Account Number: [id number] Date of Birth/Sex: Treating RN: Jun 07, 1925 (86 y.o. [name] Primary Care Surina Storts: Other Clinician: Referring Alivya Wegman: Treating Elhadji Pecore/Extender: Cindee Lame in Treatment: 1 Encounter Discharge Information Items Post Procedure Vitals Discharge Condition: Stable Temperature (F): 98.6 Ambulatory Status:  Ambulatory Pulse (bpm): 79 Discharge Destination: Home Respiratory Rate (breaths/min): 18 Transportation: Private Auto Blood Pressure (mmHg): 132/78 Accompanied By: son Schedule Follow-up Appointment: No Clinical Summary of Care: Electronic Signature(s) Signed: 02/24/2020 5:41:32 PM By: Zandra Abts RN, BSN Entered By: Zandra Abts on 02/23/2020 14:12:17 -------------------------------------------------------------------------------- Lower Extremity Assessment Details Patient Name: Date of Service: [name] 02/23/2020 12:45 PM Medical Record Number: [id number] Patient Account Number: [id number] Date of Birth/Sex: Treating RN: 12-26-1925 (86 y.o. Rebecca Walsh Primary Care Arshawn Valdez: Other Clinician: Referring Zia Najera: Treating Acire Tang/Extender: Cindee Lame in Treatment: 1 Edema Assessment Assessed: [name]: No] [name]: No] Edema: [Left: Ye] [Right: s] Calf Left: Right: Point of Measurement: 40 cm From Medial Instep 31.7 cm Ankle Left: Right: Point of Measurement: 10 cm From Medial Instep 23.3 cm Vascular Assessment Pulses: Dorsalis Pedis Palpable: [Left:No] Electronic Signature(s) Signed: 02/23/2020 6:05:34 PM By: Zenaida Deed RN, BSN Entered By: Zenaida Deed on 02/23/2020 13:38:25 -------------------------------------------------------------------------------- Multi Wound Chart Details Patient Name: Date of Service: [name] 02/23/2020 12:45 PM Medical Record Number: [id number] Patient Account Number: [id number] Date of Birth/Sex: Treating RN: 1926-04-07 (86 y.o. Rebecca Walsh, [name] Primary Care Emmalina Espericueta: Other Clinician: Referring Soniya Ashraf: Treating Kieanna Rollo/Extender: Cindee Lame in Treatment: 1 Vital Signs Height(in): 66 Pulse(bpm): 79 Weight(lbs): 140 Blood Pressure(mmHg): 132/78 Body Mass Index(BMI): 23 Temperature(F): 98.6 Respiratory Rate(breaths/min): 18 Photos: [1:No Photos Left  Calcaneus] [2:No Photos Left, Lateral Calcaneus] [N/A:N/A N/A] Wound Location: [1:Gradually Appeared] [2:Gradually Appeared] [N/A:N/A] Wounding Event: [1:Diabetic Wound/Ulcer of the Lower] [2:Diabetic Wound/Ulcer of the Lower] [N/A:N/A] Primary Etiology: [1:Extremity Hypertension, Type II Diabetes] [2:Extremity Hypertension, Type II Diabetes] [N/A:N/A] Comorbid History: [1:10/18/2019] [2:10/18/2019] [N/A:N/A] Date Acquired: [1:1] [2:1] [N/A:N/A] Weeks of Treatment: [1:Open] [2:Healed - Epithelialized] [N/A:N/A] Wound Status: [1:2.9x2x1.1] [2:0x0x0] [N/A:N/A] Measurements L x W x D (cm) [1:4.555] [2:0] [N/A:N/A] A (cm) : rea [1:5.011] [2:0] [N/A:N/A] Volume (cm) : [1:27.50%] [2:100.00%] [N/A:N/A] % Reduction in A rea: [1:33.50%] [2:100.00%] [N/A:N/A] % Reduction in Volume: [1:Grade 2] [2:Grade 2] [N/A:N/A] Classification: [  1:Medium] [2:None Present] [N/A:N/A] Exudate A mount: [1:Serosanguineous] [2:N/A] [N/A:N/A] Exudate Type: [1:red, brown] [2:N/A] [N/A:N/A] Exudate Color: [1:Distinct, outline attached] [2:Flat and Intact] [N/A:N/A] Wound Margin: [1:Medium (34-66%)] [2:None Present (0%)] [N/A:N/A] Granulation A mount: [1:Medium (34-66%)] [2:None Present (0%)] [N/A:N/A] Necrotic A mount: [1:Fat Layer (Subcutaneous Tissue): Yes Fascia: No] [N/A:N/A] Exposed Structures: [1:Fascia: No Tendon: No Muscle: No Joint: No Bone: No None] [2:Fat Layer (Subcutaneous Tissue): No Tendon: No Muscle: No Joint: No Bone: No None] [N/A:N/A] Epithelialization: [1:Debridement - Excisional] [2:N/A] [N/A:N/A] Debridement: Pre-procedure Verification/Time Out 14:00 [2:N/A] [N/A:N/A] Taken: [1:Lidocaine 4% Topical Solution] [2:N/A] [N/A:N/A] Pain Control: [1:Subcutaneous, Slough] [2:N/A] [N/A:N/A] Tissue Debrided: [1:Skin/Subcutaneous Tissue] [2:N/A] [N/A:N/A] Level: [1:5.8] [2:N/A] [N/A:N/A] Debridement A (sq cm): [1:rea Curette] [2:N/A] [N/A:N/A] Instrument: [1:Moderate] [2:N/A] [N/A:N/A] Bleeding:  [1:Pressure] [2:N/A] [N/A:N/A] Hemostasis A chieved: [1:0] [2:N/A] [N/A:N/A] Procedural Pain: [1:0] [2:N/A] [N/A:N/A] Post Procedural Pain: [1:Procedure was tolerated well] [2:N/A] [N/A:N/A] Debridement Treatment Response: [1:2.9x2x1.1] [2:N/A] [N/A:N/A] Post Debridement Measurements L x W x D (cm) [1:5.011] [2:N/A] [N/A:N/A] Post Debridement Volume: (cm) [1:Debridement] [2:N/A] [N/A:N/A] Treatment Notes Electronic Signature(s) Signed: 02/23/2020 6:22:31 PM By: Shawn Stall Signed: 02/24/2020 8:12:45 AM By: Baltazar Najjar MD Entered By: Baltazar Najjar on 02/23/2020 14:06:29 -------------------------------------------------------------------------------- Multi-Disciplinary Care Plan Details Patient Name: Date of Service: [name] 02/23/2020 12:45 PM Medical Record Number: [id number] Patient Account Number: [id number] Date of Birth/Sex: Treating RN: [birth year] (86 y.o. [name] Primary Care Jerzee Jerome: Other Clinician: Referring Tianni Escamilla: Treating Tonie Vizcarrondo/Extender: Cindee Lame in Treatment: 1 Active Inactive Nutrition Nursing Diagnoses: Impaired glucose control: actual or potential Goals: Patient/caregiver verbalizes understanding of need to maintain therapeutic glucose control per primary care physician Date Initiated: 02/10/2020 Target Resolution Date: 03/09/2020 Goal Status: Active Interventions: Provide education on elevated blood sugars and impact on wound healing Notes: Wound/Skin Impairment Nursing Diagnoses: Impaired tissue integrity Goals: Ulcer/skin breakdown will have a volume reduction of 50% by week 8 Date Initiated: 02/10/2020 Target Resolution Date: 03/09/2020 Goal Status: Active Interventions: Provide education on ulcer and skin care Notes: Electronic Signature(s) Signed: 02/23/2020 6:22:31 PM By: Shawn Stall Entered By: Shawn Stall on 02/23/2020  13:06:55 -------------------------------------------------------------------------------- Pain Assessment Details Patient Name: Date of Service: [name] 02/23/2020 12:45 PM Medical Record Number: [id number] Patient Account Number: [id number] Date of Birth/Sex: Treating RN: 09-Sep-1925 (86 y.o. [name] Primary Care Ayona Yniguez: Other Clinician: Referring Katheline Brendlinger: Treating Love Milbourne/Extender: Cindee Lame in Treatment: 1 Active Problems Location of Pain Severity and Description of Pain Patient Has Paino No Site Locations Pain Management and Medication Current Pain Management: Electronic Signature(s) Signed: 02/23/2020 6:22:31 PM By: Shawn Stall Signed: 02/24/2020 11:39:24 AM By: Karl Ito Entered By: Karl Ito on 02/23/2020 13:19:50 -------------------------------------------------------------------------------- Patient/Caregiver Education Details Patient Name: Date of Service: [name] 10/7/2021andnbsp12:45 PM Medical Record Number: [id number] Patient Account Number: [id number] Date of Birth/Gender: Treating RN: [birth year] (86 y.o. [name] Primary Care Physician: Other Clinician: Referring Physician: Treating Physician/Extender: Cindee Lame in Treatment: 1 Education Assessment Education Provided To: Patient Education Topics Provided Elevated Blood Sugar/ Impact on Healing: Handouts: Elevated Blood Sugars: How Do They Affect Wound Healing Methods: Explain/Verbal Responses: Reinforcements needed Electronic Signature(s) Signed: 02/23/2020 6:22:31 PM By: Shawn Stall Entered By: Shawn Stall on 02/23/2020 13:07:10 -------------------------------------------------------------------------------- Wound Assessment Details Patient Name: Date of Service: [name] 02/23/2020 12:45 PM Medical Record Number: [id number] Patient Account Number: [id number] Date of Birth/Sex: Treating  RN: 04-14-1926 (86 y.o. [name] Primary Care Tristine Langi: Other Clinician: Referring  Syncere Kaminski: Treating Ashiya Kinkead/Extender: Cindee Lame in Treatment: 1 Wound Status Wound Number: 1 Primary Etiology: Diabetic Wound/Ulcer of the Lower Extremity Wound Location: Left Calcaneus Wound Status: Open Wounding Event: Gradually Appeared Comorbid History: Hypertension, Type II Diabetes Date Acquired: 10/18/2019 Weeks Of Treatment: 1 Clustered Wound: No Wound Measurements Length: (cm) 2.9 Width: (cm) 2 Depth: (cm) 1.1 Area: (cm) 4.555 Volume: (cm) 5.011 % Reduction in Area: 27.5% % Reduction in Volume: 33.5% Epithelialization: None Tunneling: No Undermining: No Wound Description Classification: Grade 2 Wound Margin: Distinct, outline attached Exudate Amount: Medium Exudate Type: Serosanguineous Exudate Color: red, brown Foul Odor After Cleansing: No Slough/Fibrino Yes Wound Bed Granulation Amount: Medium (34-66%) Exposed Structure Necrotic Amount: Medium (34-66%) Fascia Exposed: No Necrotic Quality: Adherent Slough Fat Layer (Subcutaneous Tissue) Exposed: Yes Tendon Exposed: No Muscle Exposed: No Joint Exposed: No Bone Exposed: No Treatment Notes Wound #1 (Left Calcaneus) 1. Cleanse With Wound Cleanser 3. Primary Dressing Applied Iodoflex 4. Secondary Dressing Dry Gauze Roll Gauze Heel Cup 5. Secured With Secretary/administrator) Signed: 02/23/2020 6:05:34 PM By: Zenaida Deed RN, BSN Signed: 02/23/2020 6:22:31 PM By: Shawn Stall Entered By: Zenaida Deed on 02/23/2020 13:39:03 -------------------------------------------------------------------------------- Wound Assessment Details Patient Name: Date of Service: [name] 02/23/2020 12:45 PM Medical Record Number: [id number] Patient Account Number: [id number] Date of Birth/Sex: Treating RN: 09/28/1925 (86 y.o. Rebecca Walsh, [name] Primary Care Tambra Muller: Other  Clinician: Referring Riyah Bardon: Treating Oziel Beitler/Extender: Cindee Lame in Treatment: 1 Wound Status Wound Number: 2 Primary Etiology: Diabetic Wound/Ulcer of the Lower Extremity Wound Location: Left, Lateral Calcaneus Wound Status: Healed - Epithelialized Wounding Event: Gradually Appeared Comorbid History: Hypertension, Type II Diabetes Date Acquired: 10/18/2019 Weeks Of Treatment: 1 Clustered Wound: No Wound Measurements Length: (cm) Width: (cm) Depth: (cm) Area: (cm) Volume: (cm) 0 % Reduction in Area: 100% 0 % Reduction in Volume: 100% 0 Epithelialization: None 0 Tunneling: No 0 Undermining: No Wound Description Classification: Grade 2 Wound Margin: Flat and Intact Exudate Amount: None Present Foul Odor After Cleansing: No Slough/Fibrino No Wound Bed Granulation Amount: None Present (0%) Exposed Structure Necrotic Amount: None Present (0%) Fascia Exposed: No Fat Layer (Subcutaneous Tissue) Exposed: No Tendon Exposed: No Muscle Exposed: No Joint Exposed: No Bone Exposed: No Electronic Signature(s) Signed: 02/23/2020 6:05:34 PM By: Zenaida Deed RN, BSN Signed: 02/23/2020 6:22:31 PM By: Shawn Stall Entered By: Zenaida Deed on 02/23/2020 13:40:58 -------------------------------------------------------------------------------- Vitals Details Patient Name: Date of Service: [name] 02/23/2020 12:45 PM Medical Record Number: [id number] Patient Account Number: [id number] Date of Birth/Sex: Treating RN: 1925/11/30 (86 y.o. Rebecca Walsh, [name] Primary Care Zamyra Allensworth: Other Clinician: Referring Annalyce Lanpher: Treating Devony Mcgrady/Extender: Cindee Lame in Treatment: 1 Vital Signs Time Taken: 13:19 Temperature (F): 98.6 Height (in): 66 Pulse (bpm): 79 Weight (lbs): 140 Respiratory Rate (breaths/min): 18 Body Mass Index (BMI): 22.6 Blood Pressure (mmHg): 132/78 Reference Range: 80 - 120 mg / dl Electronic  Signature(s) Signed: 02/24/2020 11:39:24 AM By: Karl Ito Entered By: Karl Ito on 02/23/2020 13:19:45

## 2020-02-24 NOTE — Progress Notes (Signed)
KIYLAH, LOYER ([id number]) Visit Report for 02/23/2020 Debridement Details Patient Name: Date of Service: [name] 02/23/2020 12:45 PM Medical Record Number: [id number] Patient Account Number: [id number] Date of Birth/Sex: Treating RN: 1925-06-29 (86 y.o. [name] Primary Care Provider: Other Clinician: Referring Provider: Treating Provider/Extender: Cindee Lame in Treatment: 1 Debridement Performed for Assessment: Wound #1 Left Calcaneus Performed By: Physician Maxwell Caul., MD Debridement Type: Debridement Severity of Tissue Pre Debridement: Fat layer exposed Level of Consciousness (Pre-procedure): Awake and Alert Pre-procedure Verification/Time Out Yes - 14:00 Taken: Start Time: 14:01 Pain Control: Lidocaine 4% T opical Solution T Area Debrided (L x W): otal 2.9 (cm) x 2 (cm) = 5.8 (cm) Tissue and other material debrided: Viable, Non-Viable, Slough, Subcutaneous, Skin: Dermis , Fibrin/Exudate, Slough Level: Skin/Subcutaneous Tissue Debridement Description: Excisional Instrument: Curette Bleeding: Moderate Hemostasis Achieved: Pressure End Time: 14:04 Procedural Pain: 0 Post Procedural Pain: 0 Response to Treatment: Procedure was tolerated well Level of Consciousness (Post- Awake and Alert procedure): Post Debridement Measurements of Total Wound Length: (cm) 2.9 Width: (cm) 2 Depth: (cm) 1.1 Volume: (cm) 5.011 Character of Wound/Ulcer Post Debridement: Improved Severity of Tissue Post Debridement: Fat layer exposed Post Procedure Diagnosis Same as Pre-procedure Electronic Signature(s) Signed: 02/23/2020 6:22:31 PM By: Shawn Stall Signed: 02/24/2020 8:12:45 AM By: Baltazar Najjar MD Entered By: Baltazar Najjar on 02/23/2020 14:06:42 -------------------------------------------------------------------------------- HPI Details Patient Name: Date of Service: [name] 02/23/2020 12:45 PM Medical Record Number:  [id number] Patient Account Number: [id number] Date of Birth/Sex: Treating RN: 18-Feb-1926 (86 y.o. [name] Primary Care Provider: Other Clinician: Referring Provider: Treating Provider/Extender: Cindee Lame in Treatment: 1 History of Present Illness HPI Description: ADMISSION 02/10/2020 This is a 86 year old woman who lives in [facility] which is an assisted living in [place]. She has home health which is surprising since she has [place] healthcare Medicaid. Nevertheless she has had 38-month history of a substantial wound on the left heel. This is in the setting of type 2 diabetes. From what I can tell they have been using Santyl on the wound bed with [facility] changing the dressing 3 times a week. She has also developed more superficial areas on the posterior and lateral part of the heel. Apparently remotely the patient had multiple toes removed on both feet secondary to infection. She is up walking active. I could see a culture from [facility] on 9/16 that showed 2+ Proteus she apparently has received a course of Keflex. They are using a shoe for her with the heel cut out in the back which is appropriate for this reason I did not see the need to give her a heel offloading shoe. Past medical history; type 2 diabetes, parotid mass, carotid artery stenosis, pressure ulcer of the left heel, hypertension, chronic renal failure stage III We could not obtain a Doppler signal in her foot therefore we could not measure ABIs at the dorsalis pedis or posterior tibial on the left 10/7; patient we admitted the clinic 2 weeks ago. She lives then I believe an assisted living in [place] city. The area on her left lateral heel is closed. The area on the medial heel still a substantive wound with some necrotic surface. We have been using Santyl but I am not exactly sure how often and were getting this changed. We changed to Iodoflex today because of  this. Electronic Signature(s) Signed: 02/24/2020 8:12:45 AM By: Baltazar Najjar MD Entered By: Baltazar Najjar on  02/23/2020 14:07:52 -------------------------------------------------------------------------------- Physical Exam Details Patient Name: Date of Service: Rebecca Walsh 02/23/2020 12:45 PM Medical Record Number: [id number] Patient Account Number: [id number] Date of Birth/Sex: Treating RN: 03-Apr-1926 (86 y.o. [name] Primary Care Provider: Other Clinician: Referring Provider: Treating Provider/Extender: Cindee Lame in Treatment: 1 Constitutional Sitting or standing Blood Pressure is within target range for patient.. Pulse regular and within target range for patient.Marland Kitchen Respirations regular, non-labored and within target range.. Temperature is normal and within the target range for the patient.Marland Kitchen Appears in no distress. Notes Wound exam; the major area on the medial part of the left heel. Deep punched out area. Better looking surface but still some necrotic debris which I removed with a #5 curette hemostasis with direct pressure this does not go to bone but it is a deep wound. There is still superficial areas posteriorly but the left lateral heel is closed Electronic Signature(s) Signed: 02/24/2020 8:12:45 AM By: Baltazar Najjar MD Entered By: Baltazar Najjar on 02/23/2020 14:08:37 -------------------------------------------------------------------------------- Physician Orders Details Patient Name: Date of Service: [name] 02/23/2020 12:45 PM Medical Record Number: [id number] Patient Account Number: [id number] Date of Birth/Sex: Treating RN: 1925-08-26 (86 y.o. 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 Dressing Change Frequency Wound #1 Left Calcaneus Change dressing three times week. - home health to change three times a week. Wound Cleansing Wound #1 Left Calcaneus Clean wound with Wound Cleanser Primary Wound Dressing Wound #1 Left Calcaneus Iodoflex Secondary Dressing Wound #1 Left Calcaneus Kerlix/Rolled Gauze Dry Gauze Heel Cup Off-Loading Other: - float left heel with pillow while resting in chair and bed to offload pressure from wound. Home Health Continue Home Health skilled nursing for wound care. - [facility] three times a week. Electronic Signature(s) Signed: 02/23/2020 6:22:31 PM By: Shawn Stall Signed: 02/24/2020 8:12:45 AM By: Baltazar Najjar MD Entered By: Shawn Stall on 02/23/2020 14:03:07 -------------------------------------------------------------------------------- Problem List Details Patient Name: Date of Service: [name] 02/23/2020 12:45 PM Medical Record Number: [id number] Patient Account Number: [id number] Date of Birth/Sex: Treating RN: 1925/10/10 (86 y.o. 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 02/10/2020 No Yes L97.421 Non-pressure chronic ulcer of left heel and midfoot limited to breakdown of 02/10/2020 No  Yes skin E11.51 Type 2 diabetes mellitus with diabetic peripheral angiopathy without gangrene 02/10/2020 No Yes E11.40 Type 2 diabetes mellitus with diabetic neuropathy, unspecified 02/10/2020 No Yes Inactive Problems ICD-10 Code Description Active Date Inactive Date L97.123 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 [name] Primary Care Provider: Other Clinician: Referring Provider: Treating Provider/Extender: Cindee Lame in Treatment: 1 Subjective History of Present Illness (HPI) ADMISSION 02/10/2020 This is a 86 year old woman who lives in [facility] which is an assisted living in [place] city. She has home health which is surprising since she has [place] healthcare Medicaid. Nevertheless she has had 82-month history of a substantial wound on the left heel. This is in the setting of type 2 diabetes. From what I can tell they have been using Santyl on the wound bed [facility] changing the dressing 3 times a week. She has also developed more superficial areas on the posterior and lateral part of the heel. Apparently remotely the patient had multiple toes removed on both feet secondary to infection. She is up walking active. I could see a culture from [facility] on 9/16 that showed 2+ Proteus she apparently has received a course of Keflex. They are using a shoe for her with the heel cut out in the back which is appropriate for this reason I did not see the need to  give her a heel offloading shoe. Past medical history; type 2 diabetes, parotid mass, carotid artery stenosis, pressure ulcer of the left heel, hypertension, chronic renal failure stage III We could not obtain a Doppler signal in her foot therefore we could not measure ABIs at the dorsalis pedis or posterior tibial on the left 10/7; patient we admitted the clinic 2 weeks ago. She lives then I believe an assisted living in [place]. The area on her left lateral heel is closed. The area on the medial heel still a substantive wound with some necrotic surface. We have been using Santyl but I am not exactly sure how often and were getting this changed. We changed to Iodoflex today because of this. Objective Constitutional Sitting or standing Blood Pressure is within target range for patient.. Pulse regular and within target range for patient.Marland Kitchen Respirations regular, non-labored and within target range.. Temperature is normal and within the target range for the patient.Marland Kitchen Appears in no distress. Vitals Time Taken: 1:19 PM, Height: 66 in, Weight: 140 lbs, BMI: 22.6, Temperature: 98.6 F, Pulse: 79 bpm, Respiratory Rate: 18 breaths/min, Blood Pressure: 132/78 mmHg. General Notes: Wound exam; the major area on the medial part of the left heel. Deep punched out area. Better looking surface but still some necrotic debris which I removed with a #5 curette hemostasis with direct pressure this does not go to bone but it is a deep wound. There is still superficial areas posteriorly but the left lateral heel is closed Integumentary (Hair, Skin) Wound #1 status is Open. Original cause of wound was Gradually Appeared. The wound is located on the Left Calcaneus. The wound measures 2.9cm length x 2cm width x 1.1cm depth; 4.555cm^2 area and 5.011cm^3 volume. There is Fat Layer (Subcutaneous Tissue) exposed. There is no tunneling or undermining noted. There is a medium amount of serosanguineous drainage noted. The  wound margin is distinct with the outline attached to the wound base. There is medium (34-66%) granulation within the wound bed. There is a medium (34-66%) amount of necrotic tissue within the wound bed including Adherent Slough. Wound #2 status is Healed - Epithelialized. Original cause of wound was Gradually Appeared. The wound is located on the Left,Lateral Calcaneus. The wound measures 0cm length x 0cm width x  0cm depth; 0cm^2 area and 0cm^3 volume. There is no tunneling or undermining noted. There is a none present amount of drainage noted. The wound margin is flat and intact. There is no granulation within the wound bed. There is no necrotic tissue within the wound bed. 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 There was a Excisional Skin/Subcutaneous Tissue Debridement with a total area of 5.8 sq cm performed by Maxwell Caul., MD. With the following instrument(s): Curette to remove Viable and Non-Viable tissue/material. Material removed includes Subcutaneous Tissue, Slough, Skin: Dermis, and Fibrin/Exudate after achieving pain control using Lidocaine 4% Topical Solution. A time out was conducted at 14:00, prior to the start of the procedure. A Moderate amount of bleeding was controlled with Pressure. The procedure was tolerated well with a pain level of 0 throughout and a pain level of 0 following the procedure. Post Debridement Measurements: 2.9cm length x 2cm width x  1.1cm depth; 5.011cm^3 volume. Character of Wound/Ulcer Post Debridement is improved. Severity of Tissue Post Debridement is: Fat layer exposed. Post procedure Diagnosis Wound #1: Same as Pre-Procedure Plan Follow-up Appointments: Return Appointment in 2 weeks. Dressing Change Frequency: Wound #1 Left Calcaneus: Change dressing three times week. - home health to change three times a week. Wound Cleansing: Wound #1 Left Calcaneus: Clean wound with Wound Cleanser Primary Wound Dressing: Wound #1 Left Calcaneus: Iodoflex Secondary Dressing: Wound #1 Left Calcaneus: Kerlix/Rolled Gauze Dry Gauze Heel Cup Off-Loading: Other: - float left heel with pillow while resting in chair and bed to offload pressure from wound. Home Health: Continue Home Health skilled nursing for wound care. - [facility] three times a week. 1. We change the primary dressing here to Iodoflex which may go better with home health schedule of 3 times a week 2. I have no idea how much they are offloading this. 3. She has her vascular studies on Monday and interventional radiology Electronic Signature(s) Signed: 02/24/2020 8:12:45 AM By: Baltazar Najjar MD Entered By: Baltazar Najjar on 02/23/2020 14:09:14 -------------------------------------------------------------------------------- SuperBill Details Patient Name: Date of Service: [name] 02/23/2020 Medical Record Number: [id number] Patient Account Number: [id number] Date of Birth/Sex: Treating RN: 01/22/1926 (86 y.o. 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 L97.421 Non-pressure chronic ulcer of left heel and midfoot limited to breakdown of skin E11.51 Type 2  diabetes mellitus with diabetic peripheral angiopathy without gangrene E11.40 Type 2 diabetes mellitus with diabetic neuropathy, unspecified Facility Procedures CPT4 Code: [id number] Description: 11042 - DEB SUBQ TISSUE 20 SQ CM/< ICD-10 Diagnosis Description 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

## 2020-02-27 ENCOUNTER — Ambulatory Visit
Admission: RE | Admit: 2020-02-27 | Discharge: 2020-02-27 | Disposition: A | Discharge status: Home / Self Care | Payer: Medicare Other | Source: Ambulatory Visit | Attending: Internal Medicine | Admitting: Internal Medicine

## 2020-02-27 DIAGNOSIS — S91302A Unspecified open wound, left foot, initial encounter: Secondary | ICD-10-CM

## 2020-03-08 ENCOUNTER — Encounter (HOSPITAL_BASED_OUTPATIENT_CLINIC_OR_DEPARTMENT_OTHER): Payer: Medicare Other | Admitting: Internal Medicine

## 2020-03-26 ENCOUNTER — Other Ambulatory Visit (HOSPITAL_COMMUNITY): Payer: Self-pay

## 2020-03-26 ENCOUNTER — Other Ambulatory Visit: Payer: Self-pay

## 2020-03-26 ENCOUNTER — Ambulatory Visit (HOSPITAL_COMMUNITY)
Admission: RE | Admit: 2020-03-26 | Discharge: 2020-03-26 | Disposition: A | Discharge status: Home / Self Care | Payer: Medicare Other | Source: Ambulatory Visit | Attending: Internal Medicine | Admitting: Internal Medicine

## 2020-03-26 ENCOUNTER — Encounter (HOSPITAL_BASED_OUTPATIENT_CLINIC_OR_DEPARTMENT_OTHER): Payer: Medicare Other | Attending: Internal Medicine | Admitting: Internal Medicine

## 2020-03-26 ENCOUNTER — Other Ambulatory Visit (HOSPITAL_COMMUNITY): Payer: Self-pay | Admitting: Internal Medicine

## 2020-03-26 DIAGNOSIS — L97423 Non-pressure chronic ulcer of left heel and midfoot with necrosis of muscle: Secondary | ICD-10-CM | POA: Insufficient documentation

## 2020-03-26 DIAGNOSIS — E11621 Type 2 diabetes mellitus with foot ulcer: Secondary | ICD-10-CM | POA: Diagnosis not present

## 2020-03-26 DIAGNOSIS — N183 Chronic kidney disease, stage 3 unspecified: Secondary | ICD-10-CM | POA: Diagnosis not present

## 2020-03-26 DIAGNOSIS — E1122 Type 2 diabetes mellitus with diabetic chronic kidney disease: Secondary | ICD-10-CM | POA: Diagnosis not present

## 2020-03-26 DIAGNOSIS — L98499 Non-pressure chronic ulcer of skin of other sites with unspecified severity: Secondary | ICD-10-CM | POA: Insufficient documentation

## 2020-03-26 DIAGNOSIS — I6529 Occlusion and stenosis of unspecified carotid artery: Secondary | ICD-10-CM | POA: Diagnosis not present

## 2020-03-26 DIAGNOSIS — I129 Hypertensive chronic kidney disease with stage 1 through stage 4 chronic kidney disease, or unspecified chronic kidney disease: Secondary | ICD-10-CM | POA: Diagnosis not present

## 2020-03-26 DIAGNOSIS — E1151 Type 2 diabetes mellitus with diabetic peripheral angiopathy without gangrene: Secondary | ICD-10-CM | POA: Diagnosis not present

## 2020-03-27 ENCOUNTER — Other Ambulatory Visit (HOSPITAL_COMMUNITY)
Admit: 2020-03-27 | Discharge: 2020-03-27 | Disposition: A | Discharge status: Home / Self Care | Payer: Medicare Other | Source: Ambulatory Visit | Attending: Internal Medicine | Admitting: Internal Medicine

## 2020-03-27 DIAGNOSIS — E11621 Type 2 diabetes mellitus with foot ulcer: Secondary | ICD-10-CM | POA: Diagnosis present

## 2020-03-27 NOTE — Progress Notes (Signed)
ROSEY, EIDE ([id number]) Visit Report for 03/26/2020 Debridement Details Patient Name: Date of Service: [name] 03/26/2020 3:00 PM Medical Record Number: [id number] Patient Account Number: [id number] Date of Birth/Sex: Treating RN: [birth year] (86 y.o. [name] Primary Care Provider: Baltazar Najjar Other Clinician: Referring Provider: Treating Provider/Extender: Dyanne Iha in Treatment: 6 Debridement Performed for Assessment: Wound #1 Left Calcaneus Performed By: Physician Maxwell Caul., MD Debridement Type: Debridement Severity of Tissue Pre Debridement: Fat layer exposed Level of Consciousness (Pre-procedure): Awake and Alert Pre-procedure Verification/Time Out Yes - 16:22 Taken: Start Time: 16:22 Pain Control: Lidocaine 5% topical ointment T Area Debrided (L x W): otal 3 (cm) x 1.6 (cm) = 4.8 (cm) Tissue and other material debrided: Viable, Non-Viable, Eschar, Subcutaneous Level: Skin/Subcutaneous Tissue Debridement Description: Excisional Instrument: Curette Bleeding: Minimum Hemostasis Achieved: Pressure End Time: 16:23 Procedural Pain: 0 Post Procedural Pain: 0 Response to Treatment: Procedure was tolerated well Level of Consciousness (Post- Awake and Alert procedure): Post Debridement Measurements of Total Wound Length: (cm) 3 Width: (cm) 1.6 Depth: (cm) 1.5 Volume: (cm) 5.655 Character of Wound/Ulcer Post Debridement: Improved Severity of Tissue Post Debridement: Fat layer exposed Post Procedure Diagnosis Same as Pre-procedure Electronic Signature(s) Signed: 03/26/2020 6:16:40 PM By: Zandra Abts RN, BSN Signed: 03/27/2020 3:11:24 PM By: Baltazar Najjar MD Entered By: Baltazar Najjar on 03/26/2020 17:23:02 -------------------------------------------------------------------------------- HPI Details Patient Name: Date of Service: [name] 03/26/2020 3:00 PM Medical Record Number: [id number] Patient  Account Number: [id number] Date of Birth/Sex: Treating RN: 16-Oct-1925 (86 y.o. [name] Primary Care Provider: Baltazar Najjar Other Clinician: Referring Provider: Treating Provider/Extender: Dyanne Iha in Treatment: 6 History of Present Illness HPI Description: ADMISSION 02/10/2020 This is a 86 year old woman who lives in [facility] which is an assisted living in [place]. She has home health which is surprising since she has [place] healthcare Medicaid. Nevertheless she has had 42-month history of a substantial wound on the left heel. This is in the setting of type 2 diabetes. From what I can tell they have been using Santyl on the wound bed with [facility] changing the dressing 3 times a week. She has also developed more superficial areas on the posterior and lateral part of the heel. Apparently remotely the patient had multiple toes removed on both feet secondary to infection. She is up walking active. I could see a culture from [facility] on 9/16 that showed 2+ Proteus she apparently has received a course of Keflex. They are using a shoe for her with the heel cut out in the back which is appropriate for this reason I did not see the need to give her a heel offloading shoe. Past medical history; type 2 diabetes, parotid mass, carotid artery stenosis, pressure ulcer of the left heel, hypertension, chronic renal failure stage III We could not obtain a Doppler signal in her foot therefore we could not measure ABIs at the dorsalis pedis or posterior tibial on the left 10/7; patient we admitted the clinic 2 weeks ago. She lives then I believe an assisted living in [place] city. The area on her left lateral heel is closed. The area on the medial heel still a substantive wound with some necrotic surface. We have been using Santyl but I am not exactly sure how often and were getting this changed. We changed to Iodoflex today because of this. 11/8;  this is a patient who has not been here in a month and unfortunately  I have lost track of her. The arterial studies that I ordered last visit were actually done but I do not think I have seen these. There were done on Tilden/12/21 at interventional radiology. Arterial studies; left resting ABI at 1.06 resting TBI was not obtained. There was a significant pressure decreased from the popliteal to the calf great toe pressure was not obtained. She had normal triphasic waveforms in the femoral artery, slightly dampened biphasic waveform in the popliteal artery and monophasic waveforms in the runoff vessels. The interpretation was that her ABIs could be falsely elevated in the setting of lower extremity arterial calcification. There is a significant pressure gradient drop off from the femoral to the runoff vessels as could be seen with bilateral superficial femoral artery stenosis. Consider CTA MRA or direct. The patient comes in today with her son. Apparently home health is coming once a week may be twice. We have been using Iodoflex to the deep punched out area on her left medial heel Electronic Signature(s) Signed: 03/27/2020 3:11:24 PM By: Baltazar Najjar MD Entered By: Baltazar Najjar on 03/26/2020 17:25:40 -------------------------------------------------------------------------------- Physical Exam Details Patient Name: Date of Service: [name] 03/26/2020 3:00 PM Medical Record Number: [id number] Patient Account Number: [id number] Date of Birth/Sex: Treating RN: 1925-07-18 (86 y.o. [name] Primary Care Provider: Baltazar Najjar Other Clinician: Referring Provider: Treating Provider/Extender: Dyanne Iha in Treatment: 6 Constitutional Sitting or standing Blood Pressure is within target range for patient.. Pulse regular and within target range for patient.Marland Kitchen Respirations regular, non-labored and within target range.. Temperature is normal and within the  target range for the patient.Marland Kitchen Appears in no distress. Cardiovascular Pedal pulses were absent on the left. Popliteal and femoral pulses could not be felt. Notes Wound exam; left medial heel is the only wound open that I saw. This is a deep punched out wound with a necrotic surface. Again I used a #5 curette to remove the necrotic subcutaneous material. Hemostasis with silver nitrate and direct pressure. There was a significant amount of bleeding which is gratifying. No evidence of infection this did not probe to bone Electronic Signature(s) Signed: 03/27/2020 3:11:24 PM By: Baltazar Najjar MD Entered By: Baltazar Najjar on 03/26/2020 17:27:05 -------------------------------------------------------------------------------- Physician Orders Details Patient Name: Date of Service: [name] 03/26/2020 3:00 PM Medical Record Number: [id number] Patient Account Number: [id number] Date of Birth/Sex: Treating RN: 1925/11/02 (86 y.o. 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 Dressing Change Frequency Wound #1 Left Calcaneus Change dressing three times week. - home health to change three times a week. Wound Cleansing Wound #1 Left Calcaneus Clean wound with Wound Cleanser Primary Wound Dressing Wound #1 Left Calcaneus Iodoflex Secondary Dressing Wound  #1 Left Calcaneus Kerlix/Rolled Gauze Dry Gauze Heel Cup Off-Loading Other: - float left heel with pillow while resting in chair and bed to offload pressure from wound. Home Health Continue Home Health skilled nursing for wound care. - [facility] three times a week. Laboratory naerobe culture (  MICRO) - left heel - (ICD10 E11.621 - Type 2 diabetes mellitus with foot ulcer) Bacteria identified in Unspecified specimen by A LOINC Code: 635-3 Convenience Name: Anerobic culture Radiology X-ray, left heel - Non healing ulcer on left heel - (ICD10 E11.621 - Type 2 diabetes mellitus with foot ulcer) [facility] Interventional Radiology [facility]) - Abnormal arterial study 02/27/20, non healing ulcer on left heel, discuss possible intervention Electronic Signature(s) Signed: 03/26/2020 6:16:40 PM By: Zandra Abts RN, BSN Signed: 03/27/2020 3:11:24 PM By: Baltazar Najjar MD Entered By: Zandra Abts on 03/26/2020 16:32:03 Prescription 03/26/2020 -------------------------------------------------------------------------------- Candi Leash MD Patient Name: Provider: 1925-12-29 [id number] Date of Birth: NPI#Jerral Ralph Sex: DEA #: [id number] Phone #: License #: Eligha Bridegroom [facility] Patient Address: [address] [place] [address] [place], [name] [address] [place], [name] [address] [phone] Allergies No Known Allergies Provider's Orders Interventional Radiology [facility]) - Abnormal arterial study 02/27/20, non healing ulcer on left heel, discuss possible intervention Hand Signature: Date(s): Prescription 03/26/2020 Candi Leash MD Patient Name: Provider: 08/01/1925 [id number] Date of Birth: NPI#: [id number] Sex: DEA #: [phone] Phone #: License #: Eligha Bridegroom [facility] Patient Address: [address] [place] [address] [place], [name] [address] [place], [name] [address] [phone] Allergies No Known Allergies Provider's Orders X-ray, left heel - ICD10: E11.621 - Non healing ulcer on left heel Hand Signature: Date(s): Electronic Signature(s) Signed: 03/26/2020 6:16:40 PM By: Zandra Abts RN, BSN Signed: 03/27/2020 3:11:24 PM By: Baltazar Najjar MD Entered By: Zandra Abts on 03/26/2020 16:32:03 -------------------------------------------------------------------------------- Problem List Details Patient Name: Date of Service: [name] 03/26/2020 3:00 PM Medical Record Number: [id number] Patient Account Number: [id number] Date of Birth/Sex: Treating RN: Mar 16, 1926 (86 y.o. 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 02/10/2020 No Yes E11.51 Type 2 diabetes mellitus with diabetic peripheral angiopathy without gangrene 02/10/2020 No Yes E11.40 Type 2 diabetes mellitus with diabetic neuropathy, unspecified 02/10/2020 No Yes Inactive Problems ICD-10 Code Description Active Date Inactive Date L97.123 Non-pressure chronic ulcer of left thigh with necrosis of muscle 02/10/2020 02/10/2020 L97.421 Non-pressure chronic ulcer of left heel and midfoot limited to breakdown of skin 02/10/2020 02/10/2020 Resolved Problems Electronic Signature(s) Signed: 03/27/2020 3:11:24 PM By: Baltazar Najjar MD Entered By: Baltazar Najjar on 03/26/2020 17:22:43 -------------------------------------------------------------------------------- Progress Note Details Patient Name: Date of Service: [name] 03/26/2020 3:00 PM Medical  Record Number: [id number] Patient Account Number: [id number] Date of Birth/Sex: Treating RN: [birth year] (86 y.o. [name] Primary Care Provider: Baltazar Najjar Other Clinician: Referring Provider: Treating Provider/Extender: Dyanne Iha in Treatment: 6 Subjective History of Present Illness (HPI) ADMISSION 02/10/2020 This is a 86 year old woman who lives in [facility] which is an assisted living in [place] city. She has home health which is surprising since she has [place] healthcare Medicaid. Nevertheless she has had 40-month history of a substantial wound on the left heel. This is in the setting of type 2 diabetes. From what I can tell they have been using Santyl on the wound bed with [facility] changing  the dressing 3 times a week. She has also developed more superficial areas on the posterior and lateral part of the heel. Apparently remotely the patient had multiple toes removed on both feet secondary to infection. She is up walking active. I could see a culture from [facility] on 9/16 that showed 2+ Proteus she apparently has received a course of Keflex. They are using a shoe for her with the heel cut out in the back which is appropriate for this reason I did not see the need to give her a heel offloading shoe. Past medical history; type 2 diabetes, parotid mass, carotid artery stenosis, pressure ulcer of the left heel, hypertension, chronic renal failure stage III We could not obtain a Doppler signal in her foot therefore we could not measure ABIs at the dorsalis pedis or posterior tibial on the left 10/7; patient we admitted the clinic 2 weeks ago. She lives then I believe an assisted living in [place] city. The area on her left lateral heel is closed. The area on the medial heel still a substantive wound with some necrotic surface. We have been using Santyl but I am not exactly sure how often and were getting this changed. We changed to  Iodoflex today because of this. 11/8; this is a patient who has not been here in a month and unfortunately I have lost track of her. The arterial studies that I ordered last visit were actually done but I do not think I have seen these. There were done on Tilden/12/21 at interventional radiology. Arterial studies; left resting ABI at 1.06 resting TBI was not obtained. There was a significant pressure decreased from the popliteal to the calf great toe pressure was not obtained. She had normal triphasic waveforms in the femoral artery, slightly dampened biphasic waveform in the popliteal artery and monophasic waveforms in the runoff vessels. The interpretation was that her ABIs could be falsely elevated in the setting of lower extremity arterial calcification. There is a significant pressure gradient drop off from the femoral to the runoff vessels as could be seen with bilateral superficial femoral artery stenosis. Consider CTA MRA or direct. The patient comes in today with her son. Apparently home health is coming once a week may be twice. We have been using Iodoflex to the deep punched out area on her left medial heel Objective Constitutional Sitting or standing Blood Pressure is within target range for patient.. Pulse regular and within target range for patient.Marland Kitchen Respirations regular, non-labored and within target range.. Temperature is normal and within the target range for the patient.Marland Kitchen Appears in no distress. Vitals Time Taken: 3:48 PM, Height: 66 in, Weight: 140 lbs, BMI: 22.6, Temperature: 98.4 F, Pulse: 82 bpm, Respiratory Rate: 18 breaths/min, Blood Pressure: 145/70 mmHg. Cardiovascular Pedal pulses were absent on the left. Popliteal and femoral pulses could not be felt. General Notes: Wound exam; left medial heel is the only wound open that I saw. This is a deep punched out wound with a necrotic surface. Again I used a #5 curette to remove the necrotic subcutaneous material. Hemostasis  with silver nitrate and direct pressure. There was a significant amount of bleeding which is gratifying. No evidence of infection this did not probe to bone Integumentary (Hair, Skin) Wound #1 status is Open. Original cause of wound was Gradually Appeared. The wound is located on the Left Calcaneus. The wound measures 3cm length x 1.6cm width x 1.5cm depth; 3.77cm^2 area and 5.655cm^3 volume. There is Fat Layer (Subcutaneous Tissue) exposed.  There is no tunneling or undermining noted. There is a medium amount of serosanguineous drainage noted. The wound margin is distinct with the outline attached to the wound base. There is medium (34-66%) granulation within the wound bed. There is a medium (34-66%) amount of necrotic tissue within the wound bed including Adherent Slough. 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 There was a Excisional Skin/Subcutaneous Tissue Debridement with a total area of 4.8 sq cm performed by Maxwell Caul., MD. With the following instrument(s): Curette to remove Viable and Non-Viable tissue/material. Material removed includes Eschar and Subcutaneous Tissue and after achieving pain control using Lidocaine 5% topical ointment. No specimens were taken. A time out was conducted at 16:22, prior to the start of the procedure. A Minimum amount of bleeding was controlled with Pressure. The procedure was tolerated well with a pain level of 0 throughout and a pain level of 0 following the procedure. Post Debridement Measurements: 3cm length  x 1.6cm width x 1.5cm depth; 5.655cm^3 volume. Character of Wound/Ulcer Post Debridement is improved. Severity of Tissue Post Debridement is: Fat layer exposed. Post procedure Diagnosis Wound #1: Same as Pre-Procedure Plan Follow-up Appointments: Return Appointment in 2 weeks. Dressing Change Frequency: Wound #1 Left Calcaneus: Change dressing three times week. - home health to change three times a week. Wound Cleansing: Wound #1 Left Calcaneus: Clean wound with Wound Cleanser Primary Wound Dressing: Wound #1 Left Calcaneus: Iodoflex Secondary Dressing: Wound #1 Left Calcaneus: Kerlix/Rolled Gauze Dry Gauze Heel Cup Off-Loading: Other: - float left heel with pillow while resting in chair and bed to offload pressure from wound. Home Health: Continue Home Health skilled nursing for wound care. - [facility] three times a week. Laboratory ordered were: Anerobic culture - left heel ordered were: Interventional Radiology [facility]) - Abnormal arterial study 02/27/20, non healing ulcer on left heel, discuss possible intervention Radiology ordered were: X-ray, left heel - Non healing ulcer on left heel 1. Necrotic wound on the left medial calcaneus. 2. Cleans up quite nicely with debridement but I did this last time she was here almost a month ago. 3. Continue with Iodoflex, heel cup kerlix 4. After I debrided it there was an odor I did a culture but I am not adding empiric antibiotics 5. I would like to get an x-ray of the left heel 6. I am going to send her to interventional radiology for consultation. I discussed things with her son. There really is not a good option here although she is 86 years old she is still functional as far as I know lives in an assisted living. I am not sure how much assistance she requires. 7. The last creatinine I can see on this patient was in care [facility] at [facility] 1.29 in 2019. This would reflect not insignificant degrees of chronic renal  failure I am sure by this point. Electronic Signature(s) Signed: 03/27/2020 3:11:24 PM By: Baltazar Najjar MD Entered By: Baltazar Najjar on 03/26/2020 17:33:12 -------------------------------------------------------------------------------- SuperBill Details Patient Name: Date of Service: MA [name] 03/26/2020 Medical Record Number: [id number] Patient Account Number: [id number] Date of Birth/Sex: Treating RN: 1926/03/19 (86 y.o.  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

## 2020-03-27 NOTE — Progress Notes (Signed)
Rebecca Walsh, Rebecca Walsh ([id number]) Visit Report for 03/26/2020 Arrival Information Details Patient Name: Date of Service: Rebecca Walsh Rebecca Walsh 03/26/2020 3:00 PM Medical Record Number: [id number] Patient Account Number: [id number] Date of Birth/Sex: Treating RN: 1925/08/02 (86 y.o. Rebecca Walsh, Rebecca Walsh Primary Care Thaily Hackworth: Baltazar Najjar Other Clinician: Referring Faydra Korman: Treating Saquoia Sianez/Extender: Dyanne Iha in Treatment: 6 Visit Information History Since Last Visit All ordered tests and consults were completed: Yes Patient Arrived: Wheel Chair Added or deleted any medications: No Arrival Time: 15:48 Any new allergies or adverse reactions: No Accompanied By: son Had a fall or experienced change in No Transfer Assistance: None activities of daily living that may affect Patient Identification Verified: Yes risk of falls: Secondary Verification Process Completed: Yes Signs or symptoms of abuse/neglect since last visito No Patient Requires Transmission-Based Precautions: No Hospitalized since last visit: No Patient Has Alerts: No Implantable device outside of the clinic excluding No cellular tissue based products placed in the center since last visit: Has Dressing in Place as Prescribed: Yes Pain Present Now: No Electronic Signature(s) Signed: 03/26/2020 4:07:04 PM By: Karl Ito Entered By: Karl Ito on 03/26/2020 15:48:41 -------------------------------------------------------------------------------- Encounter Discharge Information Details Patient Name: Date of Service: Rebecca Walsh 03/26/2020 3:00 PM Medical Record Number: [id number] Patient Account Number: [id number] Date of Birth/Sex: Treating RN: [birth year] (86 y.o. Rebecca Walsh Primary Care Staci Carver: Baltazar Najjar Other Clinician: Referring Aries Kasa: Treating Verlan Grotz/Extender: Dyanne Iha in Treatment: 6 Encounter Discharge Information Items Post Procedure  Vitals Discharge Condition: Stable Temperature (F): 98.4 Ambulatory Status: Wheelchair Pulse (bpm): 82 Discharge Destination: Home Respiratory Rate (breaths/min): 18 Transportation: Private Auto Blood Pressure (mmHg): 145/70 Accompanied By: self Schedule Follow-up Appointment: Yes Clinical Summary of Care: Electronic Signature(s) Signed: 03/26/2020 5:46:03 PM By: Shawn Stall Entered By: Shawn Stall on 03/26/2020 16:47:56 -------------------------------------------------------------------------------- Lower Extremity Assessment Details Patient Name: Date of Service: Rebecca Walsh 03/26/2020 3:00 PM Medical Record Number: [id number] Patient Account Number: [id number] Date of Birth/Sex: Treating RN: 06/08/1925 (86 y.o. Rebecca Walsh Primary Care Tanish Prien: Baltazar Najjar Other Clinician: Referring Lunabelle Oatley: Treating Myrella Fahs/Extender: Dyanne Iha in Treatment: 6 Edema Assessment Assessed: Rebecca Walsh: Yes] Rebecca Walsh: No] Edema: [Left: Ye] [Right: s] Calf Left: Right: Point of Measurement: 40 cm From Medial Instep 30 cm Ankle Left: Right: Point of Measurement: 10 cm From Medial Instep 24 cm Electronic Signature(s) Signed: 03/26/2020 5:46:03 PM By: Shawn Stall Entered By: Shawn Stall on 03/26/2020 16:00:29 -------------------------------------------------------------------------------- Multi Wound Chart Details Patient Name: Date of Service: Rebecca Walsh 03/26/2020 3:00 PM Medical Record Number: [id number] Patient Account Number: [id number] Date of Birth/Sex: Treating RN: 1925/05/25 (86 y.o. Rebecca Walsh Primary Care Loghan Subia: Baltazar Najjar Other Clinician: Referring Osman Calzadilla: Treating Tonia Avino/Extender: Dyanne Iha in Treatment: 6 Vital Signs Height(in): 66 Pulse(bpm): 82 Weight(lbs): 140 Blood Pressure(mmHg): 145/70 Body Mass Index(BMI): 23 Temperature(F): 98.4 Respiratory Rate(breaths/min):  18 Photos: [1:No Photos Left Calcaneus] [N/A:N/A N/A] Wound Location: [1:Gradually Appeared] [N/A:N/A] Wounding Event: [1:Diabetic Wound/Ulcer of the Lower] [N/A:N/A] Primary Etiology: [1:Extremity Hypertension, Type II Diabetes] [N/A:N/A] Comorbid History: [1:10/18/2019] [N/A:N/A] Date Acquired: [1:6] [N/A:N/A] Weeks of Treatment: [1:Open] [N/A:N/A] Wound Status: [1:3x1.6x1.5] [N/A:N/A] Measurements L x W x D (cm) [1:3.77] [N/A:N/A] A (cm) : rea [1:5.655] [N/A:N/A] Volume (cm) : [1:40.00%] [N/A:N/A] % Reduction in A rea: [1:25.00%] [N/A:N/A] % Reduction in Volume: [1:Grade 2] [N/A:N/A] Classification: [1:Medium] [N/A:N/A] Exudate A mount: [1:Serosanguineous] [N/A:N/A] Exudate Type: [1:red, brown] [N/A:N/A] Exudate Color: [1:Distinct, outline attached] [N/A:N/A] Wound Margin: [1:Medium (34-66%)] [N/A:N/A]  Granulation Amount: [1:Medium (34-66%)] [N/A:N/A] Necrotic Amount: [1:Fat Layer (Subcutaneous Tissue): Yes N/A] Exposed Structures: [1:Fascia: No Tendon: No Muscle: No Joint: No Bone: No None] [N/A:N/A] Epithelialization: [1:Debridement - Excisional] [N/A:N/A] Debridement: Pre-procedure Verification/Time Out 16:22 [N/A:N/A] Taken: [1:Lidocaine 5% topical ointment] [N/A:N/A] Pain Control: [1:Necrotic/Eschar, Subcutaneous] [N/A:N/A] Tissue Debrided: [1:Skin/Subcutaneous Tissue] [N/A:N/A] Level: [1:4.8] [N/A:N/A] Debridement A (sq cm): [1:rea Curette] [N/A:N/A] Instrument: [1:Minimum] [N/A:N/A] Bleeding: [1:Pressure] [N/A:N/A] Hemostasis A chieved: [1:0] [N/A:N/A] Procedural Pain: [1:0] [N/A:N/A] Post Procedural Pain: [1:Procedure was tolerated well] [N/A:N/A] Debridement Treatment Response: [1:3x1.6x1.5] [N/A:N/A] Post Debridement Measurements L x W x D (cm) [1:5.655] [N/A:N/A] Post Debridement Volume: (cm) [1:Debridement] [N/A:N/A] Treatment Notes Wound #1 (Left Calcaneus) 1. Cleanse With Wound Cleanser Soap and water 3. Primary Dressing Applied Iodoflex 4. Secondary  Dressing Dry Gauze Roll Gauze Heel Cup 5. Secured With [employer]) Signed: 03/26/2020 6:16:40 PM By: Zandra Abts RN, BSN Signed: 03/27/2020 3:11:24 PM By: Baltazar Najjar MD Entered By: Baltazar Najjar on 03/26/2020 17:22:51 -------------------------------------------------------------------------------- Multi-Disciplinary Care Plan Details Patient Name: Date of Service: Rebecca Walsh 03/26/2020 3:00 PM Medical Record Number: [id number] Patient Account Number: [id number] Date of Birth/Sex: Treating RN: 06-18-1925 (86 y.o. Rebecca Walsh Primary Care Clella Mckeel: Baltazar Najjar Other Clinician: Referring Chaniah Cisse: Treating Keelen Quevedo/Extender: Dyanne Iha in Treatment: 6 Active Inactive Wound/Skin Impairment Nursing Diagnoses: Impaired tissue integrity Goals: Ulcer/skin breakdown will have a volume reduction of 50% by week 8 Date Initiated: 02/10/2020 Target Resolution Date: 04/13/2020 Goal Status: Active Interventions: Provide education on ulcer and skin care Notes: Electronic Signature(s) Signed: 03/26/2020 6:16:40 PM By: Zandra Abts RN, BSN Entered By: Zandra Abts on 03/26/2020 18:10:31 -------------------------------------------------------------------------------- Pain Assessment Details Patient Name: Date of Service: Rebecca Rebecca Walsh 03/26/2020 3:00 PM Medical Record Number: [id number] Patient Account Number: [id number] Date of Birth/Sex: Treating RN: June 18, 1925 (86 y.o. Rebecca Walsh Primary Care Creed Kail: Baltazar Najjar Other Clinician: Referring Tyton Abdallah: Treating Maria Coin/Extender: Dyanne Iha in Treatment: 6 Active Problems Location of Pain Severity and Description of Pain Patient Has Paino No Site Locations Pain Management and Medication Current Pain Management: Electronic Signature(s) Signed: 03/26/2020 4:07:04 PM By: Karl Ito Signed: 03/26/2020 6:16:40 PM  By: Zandra Abts RN, BSN Entered By: Karl Ito on 03/26/2020 15:49:05 -------------------------------------------------------------------------------- Patient/Caregiver Education Details Patient Name: Date of Service: Rebecca Rebecca Walsh 11/8/2021andnbsp3:00 PM Medical Record Number: [id number] Patient Account Number: [id number] Date of Birth/Gender: Treating RN: May 17, 1926 (86 y.o. Rebecca Walsh Primary Care Physician: Baltazar Najjar Other Clinician: Referring Physician: Treating Physician/Extender: Dyanne Iha in Treatment: 6 Education Assessment Education Provided To: Patient Education Topics Provided Wound/Skin Impairment: Methods: Explain/Verbal Responses: State content correctly Electronic Signature(s) Signed: 03/26/2020 6:16:40 PM By: Zandra Abts RN, BSN Entered By: Zandra Abts on 03/26/2020 18:10:39 -------------------------------------------------------------------------------- Wound Assessment Details Patient Name: Date of Service: Rebecca Rebecca Walsh 03/26/2020 3:00 PM Medical Record Number: [id number] Patient Account Number: [id number] Date of Birth/Sex: Treating RN: Jun 18, 1925 (86 y.o. Rebecca Walsh Primary Care Hiro Vipond: Baltazar Najjar Other Clinician: Referring Jami Ohlin: Treating Barry Faircloth/Extender: Dyanne Iha in Treatment: 6 Wound Status Wound Number: 1 Primary Etiology: Diabetic Wound/Ulcer of the Lower Extremity Wound Location: Left Calcaneus Wound Status: Open Wounding Event: Gradually Appeared Comorbid History: Hypertension, Type II Diabetes Date Acquired: 10/18/2019 Weeks Of Treatment: 6 Clustered Wound: No Wound Measurements Length: (cm) 3 Width: (cm) 1.6 Depth: (cm) 1.5 Area: (cm) 3.77 Volume: (cm) 5.655 % Reduction in Area: 40% % Reduction in Volume: 25% Epithelialization: None Tunneling: No Undermining:  No Wound Description Classification: Grade 2 Wound Margin:  Distinct, outline attached Exudate Amount: Medium Exudate Type: Serosanguineous Exudate Color: red, brown Foul Odor After Cleansing: No Slough/Fibrino Yes Wound Bed Granulation Amount: Medium (34-66%) Exposed Structure Necrotic Amount: Medium (34-66%) Fascia Exposed: No Necrotic Quality: Adherent Slough Fat Layer (Subcutaneous Tissue) Exposed: Yes Tendon Exposed: No Muscle Exposed: No Joint Exposed: No Bone Exposed: No Treatment Notes Wound #1 (Left Calcaneus) 1. Cleanse With Wound Cleanser Soap and water 3. Primary Dressing Applied Iodoflex 4. Secondary Dressing Dry Gauze Roll Gauze Heel Cup 5. Secured With [employer]) Signed: 03/26/2020 5:46:03 PM By: Shawn Stall Signed: 03/26/2020 6:16:40 PM By: Zandra Abts RN, BSN Entered By: Shawn Stall on 03/26/2020 16:01:35 -------------------------------------------------------------------------------- Vitals Details Patient Name: Date of Service: Rebecca Walsh 03/26/2020 3:00 PM Medical Record Number: [id number] Patient Account Number: [id number] Date of Birth/Sex: Treating RN: April 29, 1926 (86 y.o. Rebecca Walsh Primary Care Baya Lentz: Baltazar Najjar Other Clinician: Referring Lenore Moyano: Treating Bettie Capistran/Extender: Dyanne Iha in Treatment: 6 Vital Signs Time Taken: 15:48 Temperature (F): 98.4 Height (in): 66 Pulse (bpm): 82 Weight (lbs): 140 Respiratory Rate (breaths/min): 18 Body Mass Index (BMI): 22.6 Blood Pressure (mmHg): 145/70 Reference Range: 80 - 120 mg / dl Electronic Signature(s) Signed: 03/26/2020 4:07:04 PM By: Karl Ito Entered By: Karl Ito on 03/26/2020 15:48:59

## 2020-03-28 ENCOUNTER — Other Ambulatory Visit: Payer: Self-pay | Admitting: Internal Medicine

## 2020-03-28 ENCOUNTER — Telehealth: Payer: Self-pay | Admitting: *Deleted

## 2020-03-28 DIAGNOSIS — S81802A Unspecified open wound, left lower leg, initial encounter: Secondary | ICD-10-CM

## 2020-03-28 NOTE — Telephone Encounter (Signed)
Have tried several tries to reach pt son Rebecca Walsh to schedule consult for Rebecca Walsh referred by Dr Rebecca Walsh and the phone is asking for a mail box number? Unable to leave a msg. This is the only number for the patient.

## 2020-03-29 LAB — AEROBIC CULTURE W GRAM STAIN (SUPERFICIAL SPECIMEN)

## 2020-04-05 ENCOUNTER — Encounter: Payer: Self-pay | Admitting: *Deleted

## 2020-04-05 ENCOUNTER — Ambulatory Visit
Admission: RE | Admit: 2020-04-05 | Discharge: 2020-04-05 | Disposition: A | Discharge status: Home / Self Care | Payer: Medicare Other | Source: Ambulatory Visit | Attending: Internal Medicine | Admitting: Internal Medicine

## 2020-04-05 DIAGNOSIS — S81802A Unspecified open wound, left lower leg, initial encounter: Secondary | ICD-10-CM

## 2020-04-05 HISTORY — PX: IR RADIOLOGIST EVAL & MGMT: IMG5224

## 2020-04-05 NOTE — Consult Note (Signed)
Chief Complaint: Left foot wound  Referring Physician(s): Robson,Michael G  History of Present Illness: Rebecca Walsh is a 86 y.o. female presenting as a scheduled consultation to [facility] clinic, kindly referred by Dr. Leanord Hawking of the [facility], for evaluation of left foot wound.   Rebecca Walsh is here today with her family, including her son and her son's girlfriend. Her son gives the majority of the history, as the patient has some dementia.   They tell me that the wound started about 6 months ago, and has been getting routine wound care since then.  They live in [place], [name].  She has been getting advanced wound care with Dr. Leanord Hawking since September.    The wound is on the medial heel of the left foot.  She does have a history of prior bilateral toe amputation, which she has healed.  Her son tells me that those amputations were performed years ago.  I cannot elicit a history of claudication.  She says she does walk at her home, but she is not walking far.  She typically uses a cane or walker.  [facility] in [facility] [place].   There is an assigned physician to her ALF, and they tell me she is treated for DM II (metformin, no insulin), HLD, chronic renal disease.  Dementia (aricept). She also has history of carotid disease  She does take daily aspirin, no blood thinners, with no history of Afib.    No history of MI or stroke.  She is a never-smoker.   Non-invasive exam:  Right ABI: 0.91 Left ABI: 1.05.    Segmental exam of the left leg shows waveforms of the fem-pop segment relatively maintained, with monophasic tibial waveforms at the ankles.  The ABI is falsely elevated, likely due to non-compressible vessels.   No past medical history on file.   Allergies: Patient has no allergy information on record.  Medications: Prior to Admission medications   Not on File     No family history on file.  Social History    Socioeconomic History  . Marital status: Single    Spouse name: Not on file  . Number of children: Not on file  . Years of education: Not on file  . Highest education level: Not on file  Occupational History  . Not on file  Tobacco Use  . Smoking status: Not on file  Substance and Sexual Activity  . Alcohol use: Not on file  . Drug use: Not on file  . Sexual activity: Not on file  Other Topics Concern  . Not on file  Social History Narrative  . Not on file   Social Determinants of Health   Financial Resource Strain:   . Difficulty of Paying Living Expenses: Not on file  Food Insecurity:   . Worried About Programme researcher, broadcasting/film/video in the Last Year: Not on file  . Ran Out of Food in the Last Year: Not on file  Transportation Needs:   . Lack of Transportation (Medical): Not on file  . Lack of Transportation (Non-Medical): Not on file  Physical Activity:   . Days of Exercise per Week: Not on file  . Minutes of Exercise per Session: Not on file  Stress:   . Feeling of Stress : Not on file  Social Connections:   . Frequency of Communication with Friends and Family: Not on file  . Frequency of Social Gatherings with Friends and Family:  Not on file  . Attends Religious Services: Not on file  . Active Member of Clubs or Organizations: Not on file  . Attends Banker Meetings: Not on file  . Marital Status: Not on file       Review of Systems: A 12 point ROS discussed and pertinent positives are indicated in the HPI above.  All other systems are negative.  Review of Systems  Vital Signs: There were no vitals taken for this visit.  Physical Exam General: 86 yo female appearing younger than stated age.  Well-developed, well-nourished.  No distress. HEENT: Atraumatic, normocephalic.  Conjugate gaze, extra-ocular motor intact. No scleral icterus or scleral injection. No lesions on external ears, nose, lips, or gums.  Oral mucosa moist, pink.  Neck: Symmetric with no  goiter enlargement.  Chest/Lungs:  Symmetric chest with inspiration/expiration.  No labored breathing.  Clear to auscultation with no wheezes, rhonchi, or rales.  Heart:  RRR, with no third heart sounds appreciated. No JVD appreciated.  Abdomen:  Soft, NT/ND, with + bowel sounds.   Genito-urinary: Deferred Neurologic: Alert & Oriented to person, place, and time.   Normal affect and insight.  Appropriate questions.  Moving all 4 extremities with gross sensory intact.  Pulse Exam:  Doppler positive right and left DP and PT.  Extremities: Left medial heel wound, full thickness, with eschar/necrosis, estimated 3cm-4cm square.  No marginal erythema.  Dry crater with no oozing.   Mallampati Score:     Imaging: DG Os Calcis Left  Result Date: 03/27/2020 CLINICAL DATA:  Left heel ulcer. EXAM: LEFT OS CALCIS - 2+ VIEW COMPARISON:  None. FINDINGS: Medial heel ulcer. No underlying calcaneal bony destruction or periosteal reaction. Mild dorsal midfoot degenerative spurring. IMPRESSION: 1. Medial heel ulcer without radiographic evidence of osteomyelitis. Electronically Signed   By: Obie Dredge M.D.   On: 03/27/2020 16:18   IR Radiologist Eval & Mgmt  Result Date: 04/05/2020 Please refer to notes tab for details about interventional procedure. (Op Note)   Labs:  CBC: No results for input(s): WBC, HGB, HCT, PLT in the last 8760 hours.  COAGS: No results for input(s): INR, APTT in the last 8760 hours.  BMP: No results for input(s): NA, K, CL, CO2, GLUCOSE, BUN, CALCIUM, CREATININE, GFRNONAA, GFRAA in the last 8760 hours.  Invalid input(s): CMP  LIVER FUNCTION TESTS: No results for input(s): BILITOT, AST, ALT, ALKPHOS, PROT, ALBUMIN in the last 8760 hours.  TUMOR MARKERS: No results for input(s): AFPTM, CEA, CA199, CHROMGRNA in the last 8760 hours.  Assessment and Plan:  Assessment:  Rebecca Walsh is a 86 yo female presenting with left heel decubitus ulcer, Rutherford 6 class symptoms of  CLI.   The WIfI category (using surrogate 0.4ABI for the falsely elevated ABI) is high risk, with high revascularization benefit.   I suspect mostly left tibial disease given the segmental exam.   I had a lengthy discussion with Rebecca Walsh and her family regarding anatomy, pathology/pathophysiology, natural history, and prognosis of PAD/CLI.  Informed consent regarding treatment strategies was performed which would possibly include medical management vs endovascular options, with risk/benefit discussion.  The indications for treatment supported by updated guidelines1, 2 were discussed.  We did discuss the strategy of primary amputation as treatment, but her family has concerns for a very rapid deterioration given that she still remains active at the ALF, and she would then need to change locations for additional care if BKA/AKA was employed.  I agree.   Patient and  her family have elected to proceed with endovascular options.   Regarding endovascular options, specific risks discussed include: bleeding, infection, contrast reaction, renal injury/nephropathy, arterial injury/dissection, need for additional procedure/surgery, worsening symptoms/tissue including limb loss, cardiopulmonary collapse, death.    After treatment, we anticipate dual anti-platelet therapy for 90 days.   Annual flu vaccination is also recommended, with Class 1 recommendation1.   Plan: -Plan is to proceed with aorto-peripheral angiogram and possible intervention, with Dr. Loreta Ave, at [facility].  Target is the left leg.  Possible CO2 angio based on labs on day of.  - We will decide antegrade vs contralateral approach based on bedside US exam on the day of procedure. -Recommend continuing maximal medical therapy for cardiovascular risk reduction, including anti-platelet therapy. - After our procedure we will need to hold metformin x 48 hours.  - Continue wound care -Annual flu vaccination is recommended in the setting of known PAD,  in the absence of contra-indications.    ___________________________________________________________________   1Monte Fantasia MD, et al. 2016 AHA/ACC Guideline on the Management of Patients With Lower Extremity Peripheral Artery Disease: Executive Summary: A Report of the American College of Cardiology/American Heart Association Task Force on Clinical Practice Guidelines. J Am Coll Cardiol. 2017 Mar 21;69(11):1465-1508. doi: 10.1016/j.jacc.2016.11.008.   2 - Norgren L, et al. TASC II Working Group. Inter-society consensus for the management of peripheral arterial disease. Int Nunzio Cobbs. 2007 Jun;26(2):81-157. Review. PubMed PMID: 76546503  3 - Hingorani A, et al. The management of diabetic foot: A clinical practice guideline by the Society for Vascular Surgery in collaboration with the American Podiatric Medical Association and the Society  for Vascular Medicine. J Vasc Surg. 2016 Feb;63(2 Suppl):3S-21S. doi: 10.1016/j.jvs.2015.10.003. PubMed PMID: 54656812.  4 - Luther Hearing, Saab FA, Elyse Jarvis, Danae Orleans, Deeann Cree, Driver VR, [place], Lookstein R, van den Tilman Neat, Jaff MR, Reinaldo Raddle, Henao S, AlMahameed A, Katzen B. Digital Subtraction Angiography Prior to an Amputation for Critical Limb Ischemia (CLI): An Expert Recommendation Statement From the CLI Global Society to Optimize Limb Salvage. J Endovasc Ther. 2020 Aug;27(4):540-546. doi: 10.1177/1526602820928590. Epub 2020 May 29. PMID: 75170017.    Thank you for this interesting consult.  I greatly enjoyed meeting Rebecca Walsh and look forward to participating in their care.  A copy of this report was sent to the requesting provider on this date.  Electronically Signed: Gilmer Mor 04/05/2020, 4:05 PM   I spent a total of  60 Minutes   in face to face in clinical consultation, greater than 50% of which was counseling/coordinating care for left leg gangrene, left heel, possible angiogram and intervention.

## 2020-04-06 ENCOUNTER — Other Ambulatory Visit: Payer: Self-pay

## 2020-04-09 ENCOUNTER — Encounter (HOSPITAL_BASED_OUTPATIENT_CLINIC_OR_DEPARTMENT_OTHER): Payer: Medicare Other | Admitting: Physician Assistant

## 2020-04-11 ENCOUNTER — Encounter (HOSPITAL_BASED_OUTPATIENT_CLINIC_OR_DEPARTMENT_OTHER): Payer: Medicare Other | Admitting: Physician Assistant

## 2020-04-11 ENCOUNTER — Other Ambulatory Visit: Payer: Self-pay

## 2020-04-11 DIAGNOSIS — E11621 Type 2 diabetes mellitus with foot ulcer: Secondary | ICD-10-CM | POA: Diagnosis not present

## 2020-04-11 NOTE — Progress Notes (Addendum)
LATRISE, BOWLAND ([id number]) Visit Report for 04/11/2020 Chief Complaint Document Details Patient Name: Date of Service: Rebecca Walsh 04/11/2020 2:30 PM Medical Record Number: [id number] Patient Account Number: [id number] Date of Birth/Sex: Treating RN: March 30, 1926 (86 y.o. Rebecca Walsh Primary Care Provider: Baltazar Najjar Other Clinician: Referring Provider: Treating Provider/Extender: Erenest Blank in Treatment: 8 Information Obtained from: Patient Chief Complaint 02/10/2020; this patient arrives accompanied by her son and daughter-in-law for review of wounds on the left calcaneus Electronic Signature(s) Signed: 04/11/2020 2:21:57 PM By: Lenda Kelp PA-C Entered By: Lenda Kelp on 04/11/2020 14:21:56 -------------------------------------------------------------------------------- Debridement Details Patient Name: Date of Service: [name] 04/11/2020 2:30 PM Medical Record Number: [id number] Patient Account Number: [id number] Date of Birth/Sex: Treating RN: Oct 20, 1925 (86 y.o. [name], [name] Primary Care Provider: Baltazar Najjar Other Clinician: Referring Provider: Treating Provider/Extender: Erenest Blank in Treatment: 8 Debridement Performed for Assessment: Wound #1 Left Calcaneus Performed By: Physician Lenda Kelp, PA Debridement Type: Debridement Severity of Tissue Pre Debridement: Fat layer exposed Level of Consciousness (Pre-procedure): Awake and Alert Pre-procedure Verification/Time Out Yes - 14:50 Taken: Start Time: 14:50 Pain Control: Lidocaine 5% topical ointment T Area Debrided (L x W): otal 3.1 (cm) x 2.2 (cm) = 6.82 (cm) Tissue and other material debrided: Viable, Non-Viable, Callus, Slough, Subcutaneous, Skin: Epidermis, Slough Level: Skin/Subcutaneous Tissue Debridement Description: Excisional Instrument: Curette Bleeding: Minimum Hemostasis Achieved: Pressure End Time:  14:53 Procedural Pain: 0 Post Procedural Pain: 0 Response to Treatment: Procedure was tolerated well Level of Consciousness (Post- Awake and Alert procedure): Post Debridement Measurements of Total Wound Length: (cm) 3.1 Width: (cm) 2.2 Depth: (cm) 1.2 Volume: (cm) 6.428 Character of Wound/Ulcer Post Debridement: Improved Severity of Tissue Post Debridement: Fat layer exposed Post Procedure Diagnosis Same as Pre-procedure Electronic Signature(s) Signed: 04/11/2020 5:04:05 PM By: Lenda Kelp PA-C Signed: 04/16/2020 5:13:46 PM By: Fonnie Mu RN Entered By: Fonnie Mu on 04/11/2020 14:56:19 -------------------------------------------------------------------------------- HPI Details Patient Name: Date of Service: [name] 04/11/2020 2:30 PM Medical Record Number: [id number] Patient Account Number: [id number] Date of Birth/Sex: Treating RN: 1926/02/13 (86 y.o. Rebecca Walsh Primary Care Provider: Baltazar Najjar Other Clinician: Referring Provider: Treating Provider/Extender: Erenest Blank in Treatment: 8 History of Present Illness HPI Description: ADMISSION 02/10/2020 This is a 86 year old woman who lives in [facility] which is an assisted living in [place]. She has home health which is surprising since she has [place] healthcare Medicaid. Nevertheless she has had 74-month history of a substantial wound on the left heel. This is in the setting of type 2 diabetes. From what I can tell they have been using Santyl on the wound bed with [facility] changing the dressing 3 times a week. She has also developed more superficial areas on the posterior and lateral part of the heel. Apparently remotely the patient had multiple toes removed on both feet secondary to infection. She is up walking active. I could see a culture from [facility] on 9/16 that showed 2+ Proteus she apparently has received a course of Keflex.  They are using a shoe for her with the heel cut out in the back which is appropriate for this reason I did not see the need to give her a heel offloading shoe. Past medical history; type 2 diabetes, parotid mass, carotid artery stenosis, pressure ulcer of the left heel, hypertension, chronic renal failure stage III We could not obtain a  Doppler signal in her foot therefore we could not measure ABIs at the dorsalis pedis or posterior tibial on the left 10/7; patient we admitted the clinic 2 weeks ago. She lives then I believe an assisted living in [place]. The area on her left lateral heel is closed. The area on the medial heel still a substantive wound with some necrotic surface. We have been using Santyl but I am not exactly sure how often and were getting this changed. We changed to Iodoflex today because of this. 11/8; this is a patient who has not been here in a month and unfortunately I have lost track of her. The arterial studies that I ordered last visit were actually done but I do not think I have seen these. There were done on Tilden/12/21 at interventional radiology. Arterial studies; left resting ABI at 1.06 resting TBI was not obtained. There was a significant pressure decreased from the popliteal to the calf great toe pressure was not obtained. She had normal triphasic waveforms in the femoral artery, slightly dampened biphasic waveform in the popliteal artery and monophasic waveforms in the runoff vessels. The interpretation was that her ABIs could be falsely elevated in the setting of lower extremity arterial calcification. There is a significant pressure gradient drop off from the femoral to the runoff vessels as could be seen with bilateral superficial femoral artery stenosis. Consider CTA MRA or direct. The patient comes in today with her son. Apparently home health is coming once a week may be twice. We have been using Iodoflex to the deep punched out area on her left medial  heel 04/11/2020 upon evaluation today patient's wound actually showed signs of improvement compared to last evaluation. She still has some slough noted in the base of the wound some callus around the edges I did perform a careful debridement today. Electronic Signature(s) Signed: 04/11/2020 3:00:20 PM By: Lenda Kelp PA-C Entered By: Lenda Kelp on 04/11/2020 15:00:20 -------------------------------------------------------------------------------- Physical Exam Details Patient Name: Date of Service: [name] 04/11/2020 2:30 PM Medical Record Number: [id number] Patient Account Number: [id number] Date of Birth/Sex: Treating RN: 06/14/1925 (86 y.o. Rebecca Walsh Primary Care Provider: Baltazar Najjar Other Clinician: Referring Provider: Treating Provider/Extender: Erenest Blank in Treatment: 8 Constitutional Well-nourished and well-hydrated in no acute distress. Respiratory normal breathing without difficulty. Psychiatric this patient is able to make decisions and demonstrates good insight into disease process. Alert and Oriented x 3. pleasant and cooperative. Notes Upon inspection patient's wound bed did require sharp debridement to remove some of the necrotic tissue. The patient tolerated this today without complication as far as the debridement was concerned. Post debridement wound bed appears to be doing significantly better which is great news. No fevers, chills, nausea, vomiting, or diarrhea. I did not perform an aggressive debridement due to her poor circulation. She does have an upcoming appointment to be scheduled for an aortogram Electronic Signature(s) Signed: 04/11/2020 3:00:55 PM By: Lenda Kelp PA-C Entered By: Lenda Kelp on 04/11/2020 15:00:55 -------------------------------------------------------------------------------- Physician Orders Details Patient Name: Date of Service: [name] 04/11/2020 2:30  PM Medical Record Number: [id number] Patient Account Number: [id number] Date of Birth/Sex: Treating RN: 1925/10/29 (86 y.o. 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 - on Thursday Return A Dressing Change Frequency Wound #1 Left Calcaneus Change dressing three times week. - home health to change three times a week. Skin Barriers/Peri-Wound Care Moisturizing lotion - with dressing changes Wound Cleansing Wound #1 Left Calcaneus Clean wound with Wound Cleanser Primary Wound Dressing Wound #1 Left Calcaneus Iodoflex Secondary Dressing Wound #1 Left Calcaneus Kerlix/Rolled Gauze Dry Gauze Heel Cup Off-Loading Other: - float left heel with pillow while resting in chair and bed to offload pressure from wound. Home Health Continue Home Health skilled nursing for wound care. - [facility] three times a week. Consults Podiatry - diabetic foot and nail care Patient Medications llergies: No Known Allergies A Notifications Medication Indication Start End 04/11/2020 lidocaine DOSE 0 - topical 5 % gel - apply gel topical prior to debridement Electronic Signature(s) Signed: 04/11/2020 5:04:05 PM By: Lenda KelpStone III, Dejion Grillo PA-C Signed: 04/16/2020 5:13:46 PM By: Fonnie MuBreedlove, [name] RN Entered By: Fonnie MuBreedlove, [name] on 04/11/2020 14:59:17 Prescription 04/11/2020 -------------------------------------------------------------------------------- Lorain ChildesMARTIN, Jeffifer  Stone III, Yasenia Reedy PA Patient Name: Provider: [birth year] [id number] Date of Birth: NPI#Sherral Hammers: [id number] Sex: DEA #: [phone] Phone #: [facility] #[facility] Patient Address: [id number] [place] [id number] [place], [place] [address] [phone] Allergies No Known Allergies Provider's Orders Podiatry - diabetic foot and nail care Hand Signature: Date(s): Electronic Signature(s) Signed: 04/11/2020 5:04:05 PM By: Lenda KelpStone III, Cherise Fedder PA-C Signed: 04/16/2020 5:13:46 PM By: Fonnie MuBreedlove, [name] RN Entered By: Fonnie MuBreedlove, [name] on 04/11/2020 14:59:18 -------------------------------------------------------------------------------- Problem List Details Patient Name: Date of Service: MA Rebecca BaloRTIN, CEO Walsh 04/11/2020 2:30 PM Medical Record Number: [id number] Patient Account Number: [id number] Date of Birth/Sex: Treating RN: [birth year] (86 y.o. 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 02/10/2020 No Yes E11.51 Type 2 diabetes mellitus with diabetic peripheral angiopathy without gangrene 02/10/2020 No Yes E11.40 Type 2 diabetes mellitus with diabetic neuropathy, unspecified 02/10/2020 No Yes Inactive Problems ICD-10 Code Description Active Date Inactive Date L97.123 Non-pressure chronic ulcer of left thigh with necrosis of muscle 02/10/2020 02/10/2020 L97.421 Non-pressure chronic ulcer of left heel and midfoot limited to breakdown of skin 02/10/2020 02/10/2020 Resolved Problems Electronic Signature(s) Signed: 04/11/2020 2:21:46 PM By: Lenda KelpStone III, Maisy Newport  PA-C Entered By: Lenda KelpStone III, Tavyn Kurka on 04/11/2020 14:21:45 -------------------------------------------------------------------------------- Progress Note Details Patient Name: Date of Service: MA Rebecca BaloRTIN, CEO Walsh 04/11/2020 2:30 PM Medical Record Number: [id number] Patient Account Number: [id number] Date of Birth/Sex: Treating RN: [birth year] (86 y.o. Rebecca StandardF) Boehlein, Linda Primary Care Provider: Baltazar Najjarobson, Michael Other Clinician: Referring Provider: Treating Provider/Extender: Erenest BlankStone III, Morgyn Marut Robson, Michael Weeks in Treatment: 8 Subjective Chief Complaint Information obtained from Patient 02/10/2020; this patient arrives accompanied by her son and daughter-in-law for review of wounds on the left calcaneus History of Present Illness (HPI) ADMISSION 02/10/2020 This is a 86 year old woman who lives in [facility] which is an assisted living in [place]. She has home health which is surprising since she has [place] healthcare Medicaid. Nevertheless she has had  69-month history of a substantial wound on the left heel. This is in the setting of type 2 diabetes. From what I can tell they have been using Santyl on the wound bed with [facility] changing the dressing 3 times a week. She has also developed more superficial areas on the posterior and lateral part of the heel. Apparently remotely the patient had multiple toes removed on both feet secondary to infection. She is up walking active. I could see a culture from [facility] on 9/16 that showed 2+ Proteus she apparently has received a course of Keflex. They are using a shoe for her with the heel cut out in the back which is appropriate for this reason I did not see the need to give her a heel offloading shoe. Past medical history; type 2 diabetes, parotid mass, carotid artery stenosis, pressure ulcer of the left heel, hypertension, chronic renal failure stage III We could not obtain a Doppler signal in her foot therefore we could not  measure ABIs at the dorsalis pedis or posterior tibial on the left 10/7; patient we admitted the clinic 2 weeks ago. She lives then I believe an assisted living in [place]. The area on her left lateral heel is closed. The area on the medial heel still a substantive wound with some necrotic surface. We have been using Santyl but I am not exactly sure how often and were getting this changed. We changed to Iodoflex today because of this. 11/8; this is a patient who has not been here in a month and unfortunately I have lost track of her. The arterial studies that I ordered last visit were actually done but I do not think I have seen these. There were done on Tild[place]/12/21 at interventional radiology. Arterial studies; left resting ABI at 1.06 resting TBI was not obtained. There was a significant pressure decreased from the popliteal to the calf great toe pressure was not obtained. She had normal triphasic waveforms in the femoral artery, slightly dampened biphasic waveform in the popliteal artery and monophasic waveforms in the runoff vessels. The interpretation was that her ABIs could be falsely elevated in the setting of lower extremity arterial calcification. There is a significant pressure gradient drop off from the femoral to the runoff vessels as could be seen with bilateral superficial femoral artery stenosis. Consider CTA MRA or direct. The patient comes in today with her son. Apparently home health is coming once a week may be twice. We have been using Iodoflex to the deep punched out area on her left medial heel 04/11/2020 upon evaluation today patient's wound actually showed signs of improvement compared to last evaluation. She still has some slough noted in the base of the wound some callus around the edges I did perform a careful debridement today. Objective Constitutional Well-nourished and well-hydrated in no acute distress. Vitals Time Taken: 2:16 PM, Height: 66 in, Weight: 140 lbs,  BMI: 22.6, Temperature: 97.5 F, Pulse: 77 bpm, Respiratory Rate: 18 breaths/min, Blood Pressure: 130/62 mmHg. Respiratory normal breathing without difficulty. Psychiatric this patient is able to make decisions and demonstrates good insight into disease process. Alert and Oriented x 3. pleasant and cooperative. General Notes: Upon inspection patient's wound bed did require sharp debridement to remove some of the necrotic tissue. The patient tolerated this today without complication as far as the debridement was concerned. Post debridement wound bed appears to be doing significantly better which is great news. No fevers, chills, nausea, vomiting, or diarrhea. I did not  perform an aggressive debridement due to her poor circulation. She does have an upcoming appointment to be scheduled for an aortogram Integumentary (Hair, Skin) Wound #1 status is Open. Original cause of wound was Gradually Appeared. The wound is located on the Left Calcaneus. The wound measures 3.1cm length x 2.2cm width x 1.2cm depth; 5.356cm^2 area and 6.428cm^3 volume. There is Fat Layer (Subcutaneous Tissue) exposed. There is no tunneling or undermining noted. There is a medium amount of serosanguineous drainage noted. The wound margin is distinct with the outline attached to the wound base. There is large (67-100%) red, pink granulation within the wound bed. There is a small (1-33%) amount of necrotic tissue within the wound bed including Eschar and Adherent Slough. 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 There was a Excisional Skin/Subcutaneous Tissue Debridement with a total area of 6.82 sq cm performed by Lenda Kelp, PA. With the following instrument(s): Curette to remove Viable and Non-Viable tissue/material. Material removed includes Callus, Subcutaneous Tissue, Slough, and Skin: Epidermis after achieving pain control using Lidocaine 5% topical ointment. No specimens were taken. A time out was conducted at 14:50, prior to the start of the procedure. A Minimum amount of bleeding was controlled with Pressure. The procedure was tolerated well with a pain level of 0 throughout and a pain level of 0 following the procedure. Post Debridement Measurements: 3.1cm length x 2.2cm width x 1.2cm depth; 6.428cm^3 volume. Character of Wound/Ulcer Post Debridement is improved. Severity of Tissue Post Debridement is: Fat layer exposed. Post procedure Diagnosis Wound #1: Same as Pre-Procedure Plan Follow-up Appointments: Return Appointment in 2 weeks. - on Thursday Dressing Change Frequency: Wound #1 Left Calcaneus: Change dressing three times week. - home health to change three times a week. Skin Barriers/Peri-Wound Care: Moisturizing lotion - with dressing changes Wound Cleansing: Wound #1 Left Calcaneus: Clean wound with Wound Cleanser Primary Wound Dressing: Wound #1 Left Calcaneus: Iodoflex Secondary Dressing: Wound #1 Left Calcaneus: Kerlix/Rolled Gauze Dry Gauze Heel Cup Off-Loading: Other: - float left heel with pillow while resting in chair and bed to offload pressure from wound. Home Health: Continue Home Health skilled nursing for wound care. - [facility] three times a week. Consults ordered were: Podiatry - diabetic foot and nail care The following medication(s) was prescribed: lidocaine topical 5 % gel 0 apply gel topical prior to debridement was prescribed at facility 1. Would recommend currently that we will continue with the Iodoflex I feel like this is  doing a good job for her. 2. I am also can recommend that we continue with appropriate offloading to keep pressure off of the heel that is of utmost importance. 3. Muscle can recommend that the patient really does need to see podiatry for trimming of her nails I think this is also something that is very important at this time. 4. I am also can recommend that she have the appointment for the aortogram as soon as feasible in order to try to get better blood flow going here. Obviously the sooner the better in my opinion. We will see patient back for reevaluation in 2 weeks here in the clinic.  If anything worsens or changes patient will contact our office for additional recommendations. Electronic Signature(s) Signed: 04/11/2020 3:02:01 PM By: Lenda Kelp PA-C Entered By: Lenda Kelp on 04/11/2020 15:02:00 -------------------------------------------------------------------------------- SuperBill Details Patient Name: Date of Service: [name] 04/11/2020 Medical Record Number: [id number] Patient Account Number: [id number] Date of Birth/Sex: Treating RN: Sep 20, 1925 (86 y.o. 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 E11.51 Type 2 diabetes mellitus with diabetic peripheral angiopathy without gangrene E11.40 Type 2 diabetes mellitus with diabetic neuropathy, unspecified Facility Procedures CPT4 Code: [id number] Description: 11042 - DEB SUBQ TISSUE 20 SQ CM/< ICD-10 Diagnosis Description L97.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

## 2020-04-16 NOTE — Progress Notes (Signed)
MAGDALYNN, DAVILLA ([id number]) Visit Report for 04/11/2020 Arrival Information Details Patient Name: Date of Service: [name] 04/11/2020 2:30 PM Medical Record Number: [id number] Patient Account Number: [id number] Date of Birth/Sex: Treating RN: [birth year] (86 y.o. [name], [name] Primary Care Lokelani Lutes: Baltazar Najjar Other Clinician: Referring Kanija Remmel: Treating Aidan Caloca/Extender: Erenest Blank in Treatment: 8 Visit Information History Since Last Visit Added or deleted any medications: No Patient Arrived: Wheel Chair Any new allergies or adverse reactions: No Arrival Time: 14:15 Had a fall or experienced change in No Accompanied By: family member activities of daily living that may affect Transfer Assistance: None risk of falls: Patient Identification Verified: Yes Signs or symptoms of abuse/neglect since last visito No Secondary Verification Process Completed: Yes Hospitalized since last visit: No Patient Requires Transmission-Based Precautions: No Implantable device outside of the clinic excluding No Patient Has Alerts: No cellular tissue based products placed in the center since last visit: Has Dressing in Place as Prescribed: Yes Pain Present Now: No Electronic Signature(s) Signed: 04/11/2020 4:29:16 PM By: Shawn Stall Entered By: Shawn Stall on 04/11/2020 14:16:04 -------------------------------------------------------------------------------- Encounter Discharge Information Details Patient Name: Date of Service: [name] 04/11/2020 2:30 PM Medical Record Number: [id number] Patient Account Number: [id number] Date of Birth/Sex: Treating RN: [birth year] (86 y.o. Freddy Finner Primary Care Danila Eddie: Baltazar Najjar Other Clinician: Referring Dasha Kawabata: Treating Macio Kissoon/Extender: Erenest Blank in Treatment: 8 Encounter Discharge Information Items Post Procedure Vitals Discharge Condition:  Stable Temperature (F): 97.5 Ambulatory Status: Walker Pulse (bpm): 77 Discharge Destination: Home Respiratory Rate (breaths/min): 18 Transportation: Private Auto Blood Pressure (mmHg): 130/62 Accompanied By: self Schedule Follow-up Appointment: Yes Clinical Summary of Care: Patient Declined Electronic Signature(s) Signed: 04/11/2020 4:15:58 PM By: Yevonne Pax RN Entered By: Yevonne Pax on 04/11/2020 15:43:46 -------------------------------------------------------------------------------- Lower Extremity Assessment Details Patient Name: Date of Service: [name] 04/11/2020 2:30 PM Medical Record Number: [id number] Patient Account Number: [id number] Date of Birth/Sex: Treating RN: 1926-02-20 (86 y.o. [name] Primary Care Nima Kemppainen: Baltazar Najjar Other Clinician: Referring Cheo Selvey: Treating Mackinzie Vuncannon/Extender: Erenest Blank in Treatment: 8 Edema Assessment Assessed: [name]: Yes] [name]: No] Edema: [Left: Ye] [Right: s] Calf Left: Right: Point of Measurement: 40 cm From Medial Instep 32 cm Ankle Left: Right: Point of Measurement: 10 cm From Medial Instep 24 cm Vascular Assessment Pulses: Dorsalis Pedis Palpable: [Left:Yes] Electronic Signature(s) Signed: 04/11/2020 4:29:16 PM By: Shawn Stall Entered By: Shawn Stall on 04/11/2020 14:16:41 -------------------------------------------------------------------------------- Multi-Disciplinary Care Plan Details Patient Name: Date of Service: [name] 04/11/2020 2:30 PM Medical Record Number: [id number] Patient Account Number: [id number] Date of Birth/Sex: Treating RN: Jul 18, 1925 (86 y.o. [name], [name] Primary Care Kaliopi Blyden: Baltazar Najjar Other Clinician: Referring Giliana Vantil: Treating Shirlie Enck/Extender: Erenest Blank in Treatment: 8 Active Inactive Wound/Skin Impairment Nursing Diagnoses: Impaired tissue integrity Goals: Ulcer/skin  breakdown will have a volume reduction of 50% by week 8 Date Initiated: 02/10/2020 Date Inactivated: 04/11/2020 Target Resolution Date: 04/13/2020 Goal Status: Unmet Unmet Reason: severe PAD Ulcer/skin breakdown will have a volume reduction of 80% by week 12 Date Initiated: 04/11/2020 Target Resolution Date: 05/09/2020 Goal Status: Active Interventions: Provide education on ulcer and skin care Notes: Electronic Signature(s) Signed: 04/16/2020 5:13:46 PM By: Fonnie Mu RN Entered By: Fonnie Mu on 04/11/2020 14:51:44 -------------------------------------------------------------------------------- Pain Assessment Details Patient Name: Date of Service: [place] [name] 04/11/2020 2:30 PM Medical Record Number: [id number] Patient Account Number: [id number] Date of Birth/Sex: Treating RN:  07-Dec-1925 (86 y.o. [name] Primary Care Tahje Borawski: Baltazar Najjar Other Clinician: Referring Tylen Leverich: Treating Makenna Macaluso/Extender: Erenest Blank in Treatment: 8 Active Problems Location of Pain Severity and Description of Pain Patient Has Paino No Site Locations Rate the pain. Current Pain Level: 0 Pain Management and Medication Current Pain Management: Medication: No Cold Application: No Rest: No Massage: No Activity: No T.E.N.S.: No Heat Application: No Leg drop or elevation: No Is the Current Pain Management Adequate: Adequate How does your wound impact your activities of daily livingo Sleep: No Bathing: No Appetite: No Relationship With Others: No Bladder Continence: No Emotions: No Bowel Continence: No Work: No Toileting: No Drive: No Dressing: No Hobbies: No Electronic Signature(s) Signed: 04/11/2020 4:29:16 PM By: Shawn Stall Entered By: Shawn Stall on 04/11/2020 14:16:30 -------------------------------------------------------------------------------- Patient/Caregiver Education Details Patient Name: Date of  Service: [place] [name] 11/24/2021andnbsp2:30 PM Medical Record Number: [id number] Patient Account Number: [id number] Date of Birth/Gender: Treating RN: [birth year] (86 y.o. [name], [name] Primary Care Physician: Baltazar Najjar Other Clinician: Referring Physician: Treating Physician/Extender: Erenest Blank in Treatment: 8 Education Assessment Education Provided To: Patient and Caregiver Education Topics Provided Tissue Oxygenation: Methods: Explain/Verbal Responses: Reinforcements needed, State content correctly Wound/Skin Impairment: Handouts: Caring for Your Ulcer Responses: Reinforcements needed, State content correctly Electronic Signature(s) Signed: 04/16/2020 5:13:46 PM By: Fonnie Mu RN Entered By: Fonnie Mu on 04/11/2020 14:53:07 -------------------------------------------------------------------------------- Wound Assessment Details Patient Name: Date of Service: [name] 04/11/2020 2:30 PM Medical Record Number: [id number] Patient Account Number: [id number] Date of Birth/Sex: Treating RN: 03-06-1926 (86 y.o. [name] Primary Care Darion Milewski: Baltazar Najjar Other Clinician: Referring Deloris Mittag: Treating Vollie Aaron/Extender: Erenest Blank in Treatment: 8 Wound Status Wound Number: 1 Primary Etiology: Diabetic Wound/Ulcer of the Lower Extremity Wound Location: Left Calcaneus Wound Status: Open Wounding Event: Gradually Appeared Comorbid History: Hypertension, Type II Diabetes Date Acquired: 10/18/2019 Weeks Of Treatment: 8 Clustered Wound: No Wound Measurements Length: (cm) 3.1 Width: (cm) 2.2 Depth: (cm) 1.2 Area: (cm) 5.356 Volume: (cm) 6.428 % Reduction in Area: 14.8% % Reduction in Volume: 14.7% Epithelialization: None Tunneling: No Undermining: No Wound Description Classification: Grade 2 Wound Margin: Distinct, outline attached Exudate Amount: Medium Exudate  Type: Serosanguineous Exudate Color: red, brown Wound Bed Granulation Amount: Large (67-100%) Granulation Quality: Red, Pink Necrotic Amount: Small (1-33%) Necrotic Quality: Eschar, Adherent Slough Foul Odor After Cleansing: No Slough/Fibrino Yes Exposed Structure Fascia Exposed: No Fat Layer (Subcutaneous Tissue) Exposed: Yes Tendon Exposed: No Muscle Exposed: No Joint Exposed: No Bone Exposed: No Treatment Notes Wound #1 (Left Calcaneus) 1. Cleanse With Wound Cleanser 3. Primary Dressing Applied Iodoflex 4. Secondary Dressing Dry Gauze Roll Gauze Heel Cup 5. Secured With Secretary/administrator) Signed: 04/11/2020 4:29:16 PM By: Shawn Stall Entered By: Shawn Stall on 04/11/2020 14:19:27 -------------------------------------------------------------------------------- Vitals Details Patient Name: Date of Service: [name] 04/11/2020 2:30 PM Medical Record Number: [id number] Patient Account Number: [id number] Date of Birth/Sex: Treating RN: 1925/06/30 (86 y.o. [name], [name] Primary Care Faithlyn Recktenwald: Baltazar Najjar Other Clinician: Referring Zell Hylton: Treating Camdyn Beske/Extender: Erenest Blank in Treatment: 8 Vital Signs Time Taken: 14:16 Temperature (F): 97.5 Height (in): 66 Pulse (bpm): 77 Weight (lbs): 140 Respiratory Rate (breaths/min): 18 Body Mass Index (BMI): 22.6 Blood Pressure (mmHg): 130/62 Reference Range: 80 - 120 mg / dl Electronic Signature(s) Signed: 04/11/2020 4:29:16 PM By: Shawn Stall Entered By: Shawn Stall on 04/11/2020 14:16:20

## 2020-04-26 ENCOUNTER — Other Ambulatory Visit: Payer: Self-pay

## 2020-04-26 ENCOUNTER — Encounter (HOSPITAL_BASED_OUTPATIENT_CLINIC_OR_DEPARTMENT_OTHER): Payer: Medicare Other | Attending: Internal Medicine | Admitting: Internal Medicine

## 2020-04-26 DIAGNOSIS — E1151 Type 2 diabetes mellitus with diabetic peripheral angiopathy without gangrene: Secondary | ICD-10-CM | POA: Diagnosis not present

## 2020-04-26 DIAGNOSIS — E1122 Type 2 diabetes mellitus with diabetic chronic kidney disease: Secondary | ICD-10-CM | POA: Diagnosis not present

## 2020-04-26 DIAGNOSIS — L97423 Non-pressure chronic ulcer of left heel and midfoot with necrosis of muscle: Secondary | ICD-10-CM | POA: Insufficient documentation

## 2020-04-26 DIAGNOSIS — E11621 Type 2 diabetes mellitus with foot ulcer: Secondary | ICD-10-CM | POA: Diagnosis not present

## 2020-04-26 DIAGNOSIS — N183 Chronic kidney disease, stage 3 unspecified: Secondary | ICD-10-CM | POA: Diagnosis not present

## 2020-04-26 DIAGNOSIS — E1140 Type 2 diabetes mellitus with diabetic neuropathy, unspecified: Secondary | ICD-10-CM | POA: Insufficient documentation

## 2020-04-26 DIAGNOSIS — I129 Hypertensive chronic kidney disease with stage 1 through stage 4 chronic kidney disease, or unspecified chronic kidney disease: Secondary | ICD-10-CM | POA: Insufficient documentation

## 2020-04-26 NOTE — Progress Notes (Signed)
Rebecca Walsh, Rebecca Walsh ([id number]) Visit Report for 04/26/2020 Arrival Information Details Patient Name: Date of Service: [name] 04/26/2020 1:30 PM Medical Record Number: [id number] Patient Account Number: [id number] Date of Birth/Sex: Treating RN: [birth year] (86 y.o. [name] Primary Care Dorenda Pfannenstiel: Baltazar Najjar Other Clinician: Referring Jayma Volpi: Treating Danine Hor/Extender: Dyanne Iha in Treatment: 10 Visit Information History Since Last Visit Added or deleted any medications: No Patient Arrived: Wheel Chair Any new allergies or adverse reactions: No Arrival Time: 13:38 Had a fall or experienced change in No Accompanied By: daughter activities of daily living that may affect Transfer Assistance: None risk of falls: Patient Identification Verified: Yes Signs or symptoms of abuse/neglect since last visito No Secondary Verification Process Completed: Yes Hospitalized since last visit: No Patient Requires Transmission-Based Precautions: No Implantable device outside of the clinic excluding No Patient Has Alerts: No cellular tissue based products placed in the center since last visit: Has Dressing in Place as Prescribed: Yes Pain Present Now: No Electronic Signature(s) Signed: 04/26/2020 4:05:55 PM By: Zandra Abts RN, BSN Entered By: Zandra Abts on 04/26/2020 13:39:17 -------------------------------------------------------------------------------- Clinic Level of Care Assessment Details Patient Name: Date of Service: [name] 04/26/2020 1:30 PM Medical Record Number: [id number] Patient Account Number: [id number] Date of Birth/Sex: Treating RN: March 31, 1926 (86 y.o. Rebecca Walsh, Rebecca Walsh Primary Care Leana Springston: Baltazar Najjar Other Clinician: Referring Maudene Stotler: Treating Idonia Zollinger/Extender: Dyanne Iha in Treatment: 10 Clinic Level of Care Assessment Items TOOL 4 Quantity Score X- 1 0 Use when only an  EandM is performed on FOLLOW-UP visit ASSESSMENTS - Nursing Assessment / Reassessment X- 1 10 Reassessment of Co-morbidities (includes updates in patient status) X- 1 5 Reassessment of Adherence to Treatment Plan ASSESSMENTS - Wound and Skin A ssessment / Reassessment X - Simple Wound Assessment / Reassessment - one wound 1 5 []  - 0 Complex Wound Assessment / Reassessment - multiple wounds X- 1 10 Dermatologic / Skin Assessment (not related to wound area) ASSESSMENTS - Focused Assessment X- 1 5 Circumferential Edema Measurements - multi extremities X- 1 10 Nutritional Assessment / Counseling / Intervention X- 1 5 Lower Extremity Assessment (monofilament, tuning fork, pulses) []  - 0 Peripheral Arterial Disease Assessment (using hand held doppler) ASSESSMENTS - Ostomy and/or Continence Assessment and Care []  - 0 Incontinence Assessment and Management []  - 0 Ostomy Care Assessment and Management (repouching, etc.) PROCESS - Coordination of Care X - Simple Patient / Family Education for ongoing care 1 15 []  - 0 Complex (extensive) Patient / Family Education for ongoing care X- 1 10 Staff obtains , Records, T Results / Process Orders est []  - 0 Staff telephones HHA, Nursing Homes / Clarify orders / etc []  - 0 Routine Transfer to another Facility (non-emergent condition) []  - 0 Routine Hospital Admission (non-emergent condition) []  - 0 New Admissions / / Ordering NPWT Apligraf, etc. , []  - 0 Emergency Hospital Admission (emergent condition) X- 1 10 Simple Discharge Coordination []  - 0 Complex (extensive) Discharge Coordination PROCESS - Special Needs []  - 0 Pediatric / Minor Patient Management []  - 0 Isolation Patient Management []  - 0 Hearing / Language / Visual special needs []  - 0 Assessment of Community assistance (transportation, D/C planning, etc.) []  - 0 Additional assistance / Altered mentation []  - 0 Support Surface(s)  Assessment (bed, cushion, seat, etc.) INTERVENTIONS - Wound Cleansing / Measurement X - Simple Wound Cleansing - one wound 1 5 []  - 0 Complex Wound Cleansing - multiple wounds  X- 1 5 Wound Imaging (photographs - any number of wounds) []  - 0 Wound Tracing (instead of photographs) X- 1 5 Simple Wound Measurement - one wound []  - 0 Complex Wound Measurement - multiple wounds INTERVENTIONS - Wound Dressings X - Small Wound Dressing one or multiple wounds 1 10 []  - 0 Medium Wound Dressing one or multiple wounds []  - 0 Large Wound Dressing one or multiple wounds []  - 0 Application of Medications - topical []  - 0 Application of Medications - injection INTERVENTIONS - Miscellaneous []  - 0 External ear exam []  - 0 Specimen Collection (cultures, biopsies, blood, body fluids, etc.) []  - 0 Specimen(s) / Culture(s) sent or taken to Lab for analysis []  - 0 Patient Transfer (multiple staff / / Similar devices) []  - 0 Simple Staple / Suture removal (25 or less) []  - 0 Complex Staple / Suture removal (26 or more) []  - 0 Hypo / Hyperglycemic Management (close monitor of Blood Glucose) []  - 0 Ankle / Brachial Index (ABI) - do not check if billed separately X- 1 5 Vital Signs Has the patient been seen at the hospital within the last three years: Yes Total Score: 115 Level Of Care: New/Established - Level 3 Electronic Signature(s) Signed: 04/26/2020 3:27:39 PM By: RN Entered By: on 04/26/2020 13:59:01 -------------------------------------------------------------------------------- Encounter Discharge Information Details Patient Name: Date of Service: [place] 04/26/2020 1:30 PM Medical Record Number: Patient Account Number: Date of Birth/Sex: Treating RN: 1925-07-13 (86 y.o. Primary Care Rebecca Walsh: Other Clinician: Referring Infinity Jeffords: Treating Dua Mehler/Extender: in Treatment: 10 Encounter Discharge Information Items Discharge Condition: Stable Ambulatory Status: Wheelchair Discharge Destination: Home Transportation: Private Auto Accompanied By: daughter Schedule Follow-up Appointment: Yes Clinical Summary of Care: Patient Declined Electronic Signature(s) Signed: 04/26/2020 4:05:55 PM By: 14/01/2020 RN, BSN Entered By: Fonnie Mu on 04/26/2020 16:02:38 -------------------------------------------------------------------------------- Lower Extremity Assessment Details Patient Name: Date of Service: [place] 14/01/2020 04/26/2020 1:30 PM Medical Record Number: 14/01/2020 Patient Account Number: [id number] Date of Birth/Sex: Treating RN: [birth year] (86 y.o. 99 Primary Care Natane Heward: [name] Other Clinician: Referring Xavyer Steenson: Treating Menucha Dicesare/Extender: Baltazar Najjar in Treatment: 10 Edema Assessment Assessed: Dyanne Iha: No] 14/01/2020: No] Edema: [Left: Ye] [Right: s] Calf Left: Right: Point of Measurement: 40 cm From Medial Instep 32 cm Ankle Left: Right: Point of Measurement: 10 cm From Medial Instep 24 cm Vascular Assessment Pulses: Dorsalis Pedis Palpable: [Left:Yes] Electronic Signature(s) Signed: 04/26/2020 4:05:55 PM By: Zandra Abts RN, BSN Entered By: 14/01/2020 on 04/26/2020 13:45:58 -------------------------------------------------------------------------------- Multi Wound Chart Details Patient Name: Date of Service: [place] 14/01/2020 [place] 04/26/2020 1:30 PM Medical Record Number: [id number] Patient Account Number: 02/14/1926 Date of Birth/Sex: Treating RN: 1925/12/04 (86 y.o. Baltazar Najjar Primary Care Laquincy Eastridge: Dyanne Iha Other Clinician: Referring Windie Marasco: Treating Ignatz Deis/Extender: [name] in Treatment: 10 Vital Signs Height(in): 66 Pulse(bpm): 73 Weight(lbs): 140 Blood Pressure(mmHg): 144/83 Body  Mass Index(BMI): 23 Temperature(F): 97.8 Respiratory Rate(breaths/min): 16 Photos: [1:No Photos Left Calcaneus] [N/A:N/A N/A] Wound Location: [1:Gradually Appeared] [N/A:N/A] Wounding Event: [1:Diabetic Wound/Ulcer of the Lower] [N/A:N/A] Primary Etiology: [1:Extremity Hypertension, Type II Diabetes] [N/A:N/A] Comorbid History: [1:10/18/2019] [N/A:N/A] Date Acquired: [1:10] [N/A:N/A] Weeks of Treatment: [1:Open] [N/A:N/A] Wound Status: [1:2.5x1.6x1.2] [N/A:N/A] Measurements L x W x D (cm) [1:3.142] [N/A:N/A] A (cm) : rea [1:3.77] [N/A:N/A] Volume (cm) : [1:50.00%] [N/A:N/A] % Reduction in A rea: [1:50.00%] [N/A:N/A] % Reduction  in Volume: [1:Grade 2] [N/A:N/A] Classification: [1:Medium] [N/A:N/A] Exudate A mount: [1:Serosanguineous] [N/A:N/A] Exudate Type: [1:red, brown] [N/A:N/A] Exudate Color: [1:Distinct, outline attached] [N/A:N/A] Wound Margin: [1:Large (67-100%)] [N/A:N/A] Granulation A mount: [1:Pink] [N/A:N/A] Granulation Quality: [1:Small (1-33%)] [N/A:N/A] Necrotic A mount: [1:Fat Layer (Subcutaneous Tissue): Yes N/A] Exposed Structures: [1:Fascia: No Tendon: No Muscle: No Joint: No Bone: No Small (1-33%)] [N/A:N/A] Treatment Notes Electronic Signature(s) Signed: 04/26/2020 3:37:20 PM By: Baltazar Najjar MD Signed: 04/26/2020 4:11:46 PM By: Shawn Stall Entered By: Baltazar Najjar on 04/26/2020 13:58:53 -------------------------------------------------------------------------------- Multi-Disciplinary Care Plan Details Patient Name: Date of Service: [name] 04/26/2020 1:30 PM Medical Record Number: [id number] Patient Account Number: [id number] Date of Birth/Sex: Treating RN: 1925/08/25 (86 y.o. Rebecca Walsh, Rebecca Walsh Primary Care Labrea Eccleston: Baltazar Najjar Other Clinician: Referring Creta Dorame: Treating Nolah Krenzer/Extender: Dyanne Iha in Treatment: 10 Active Inactive Wound/Skin Impairment Nursing Diagnoses: Impaired tissue  integrity Goals: Ulcer/skin breakdown will have a volume reduction of 50% by week 8 Date Initiated: 02/10/2020 Date Inactivated: 04/11/2020 Target Resolution Date: 04/13/2020 Goal Status: Unmet Unmet Reason: severe PAD Ulcer/skin breakdown will have a volume reduction of 80% by week 12 Date Initiated: 04/11/2020 Target Resolution Date: 05/09/2020 Goal Status: Active Interventions: Provide education on ulcer and skin care Notes: Electronic Signature(s) Signed: 04/26/2020 3:27:39 PM By: Fonnie Mu RN Entered By: Fonnie Mu on 04/26/2020 13:55:08 -------------------------------------------------------------------------------- Pain Assessment Details Patient Name: Date of Service: [name] 04/26/2020 1:30 PM Medical Record Number: [id number] Patient Account Number: [id number] Date of Birth/Sex: Treating RN: Oct 17, 1925 (86 y.o. [name] Primary Care Jeannifer Drakeford: Baltazar Najjar Other Clinician: Referring Agapito Hanway: Treating Roux Brandy/Extender: Dyanne Iha in Treatment: 10 Active Problems Location of Pain Severity and Description of Pain Patient Has Paino No Site Locations Pain Management and Medication Current Pain Management: Electronic Signature(s) Signed: 04/26/2020 4:05:55 PM By: Zandra Abts RN, BSN Entered By: Zandra Abts on 04/26/2020 13:39:34 -------------------------------------------------------------------------------- Patient/Caregiver Education Details Patient Name: Date of Service: [name] 12/9/2021andnbsp1:30 PM Medical Record Number: [id number] Patient Account Number: [id number] Date of Birth/Gender: Treating RN: 09-20-1925 (86 y.o. Rebecca Walsh, Rebecca Walsh Primary Care Physician: Baltazar Najjar Other Clinician: Referring Physician: Treating Physician/Extender: Dyanne Iha in Treatment: 10 Education Assessment Education Provided To: Patient Education Topics  Provided Wound/Skin Impairment: Methods: Explain/Verbal Responses: State content correctly Electronic Signature(s) Signed: 04/26/2020 3:27:39 PM By: Fonnie Mu RN Entered By: Fonnie Mu on 04/26/2020 13:55:24 -------------------------------------------------------------------------------- Wound Assessment Details Patient Name: Date of Service: [name] 04/26/2020 1:30 PM Medical Record Number: [id number] Patient Account Number: [id number] Date of Birth/Sex: Treating RN: 04/15/1926 (86 y.o. [name] Primary Care Arretta Toenjes: Baltazar Najjar Other Clinician: Referring Saavi Mceachron: Treating Joycie Aerts/Extender: Dyanne Iha in Treatment: 10 Wound Status Wound Number: 1 Primary Etiology: Diabetic Wound/Ulcer of the Lower Extremity Wound Location: Left Calcaneus Wound Status: Open Wounding Event: Gradually Appeared Comorbid History: Hypertension, Type II Diabetes Date Acquired: 10/18/2019 Weeks Of Treatment: 10 Clustered Wound: No Wound Measurements Length: (cm) 2.5 Width: (cm) 1.6 Depth: (cm) 1.2 Area: (cm) 3.142 Volume: (cm) 3.77 % Reduction in Area: 50% % Reduction in Volume: 50% Epithelialization: Small (1-33%) Tunneling: No Undermining: No Wound Description Classification: Grade 2 Wound Margin: Distinct, outline attached Exudate Amount: Medium Exudate Type: Serosanguineous Exudate Color: red, brown Foul Odor After Cleansing: No Slough/Fibrino Yes Wound Bed Granulation Amount: Large (67-100%) Exposed Structure Granulation Quality: Pink Fascia Exposed: No Necrotic Amount: Small (1-33%) Fat Layer (Subcutaneous Tissue) Exposed: Yes Necrotic Quality: Adherent Slough Tendon Exposed:  No Muscle Exposed: No Joint Exposed: No Bone Exposed: No Treatment Notes Wound #1 (Calcaneus) Wound Laterality: Left Cleanser Wound Cleanser Discharge Instruction: Cleanse the wound with wound cleanser prior to applying a clean dressing  using gauze sponges, not tissue or cotton balls. Peri-Wound Care Sween Lotion (Moisturizing lotion) Discharge Instruction: Apply moisturizing lotion as directed Topical Primary Dressing IODOFLEX 0.9% Cadexomer Iodine Pad 4x6 cm Discharge Instruction: Or Iodosorb. Apply to wound bed as instructed Secondary Dressing Woven Gauze Sponge, Non-Sterile 4x4 in Discharge Instruction: Apply over primary dressing as directed. ABD Pad, 5x9 Discharge Instruction: Apply over primary dressing as directed. Secured With [employer] GroupKerlix Roll Sterile, 4.5x3.1 (in/yd) Discharge Instruction: Secure with Kerlix as directed. Paper Tape, 2x10 (in/yd) Discharge Instruction: Secure dressing with tape as directed. Compression Wrap Compression Stockings Add-Ons Electronic Signature(s) Signed: 04/26/2020 4:05:55 PM By: Zandra AbtsLynch, Shatara RN, BSN Entered By: Zandra AbtsLynch, Shatara on 04/26/2020 13:45:41 -------------------------------------------------------------------------------- Vitals Details Patient Name: Date of Service: [place] Tiajuana AmassRTIN, CEO [place] 04/26/2020 1:30 PM Medical Record Number: [id number] Patient Account Number: [id number] Date of Birth/Sex: Treating RN: Oct 17, 1925 (86 y.o. Wynelle LinkF) Lynch, Shatara Primary Care Kamoria Lucien: Baltazar Najjarobson, Michael Other Clinician: Referring Larina Lieurance: Treating Kellee Sittner/Extender: Dyanne Ihaobson, Michael Robson, Michael Weeks in Treatment: 10 Vital Signs Time Taken: 13:39 Temperature (F): 97.8 Height (in): 66 Pulse (bpm): 73 Weight (lbs): 140 Respiratory Rate (breaths/min): 16 Body Mass Index (BMI): 22.6 Blood Pressure (mmHg): 144/83 Reference Range: 80 - 120 mg / dl Electronic Signature(s) Signed: 04/26/2020 4:05:55 PM By: Zandra AbtsLynch, Shatara RN, BSN Entered By: Zandra AbtsLynch, Shatara on 04/26/2020 13:39:29

## 2020-04-26 NOTE — Progress Notes (Signed)
Mickel FuchsMARTIN, Ronee ([id number]) Visit Report for 04/26/2020 HPI Details Patient Name: Date of Service: [place] [name], CEO [place] 04/26/2020 1:30 PM Medical Record Number: [id number] Patient Account Number: [id number] Date of Birth/Sex: Treating RN: [birth year] (86 y.o. Arta SilenceF) Deaton, Bobbi Primary Care Provider: Baltazar Najjarobson, Kynzi Levay Other Clinician: Referring Provider: Treating Provider/Extender: Dyanne Ihaobson, Cola Gane, Takeesha Isley Weeks in Treatment: 10 History of Present Illness HPI Description: ADMISSION 02/10/2020 This is a 86 year old woman who lives in [facility] which is an assisted living in [place]. She has home health which is surprising since she has [place] Medicaid. Nevertheless she has had 2039-month history of a substantial wound on the left heel. This is in the setting of type 2 diabetes. From what I can tell they have been using Santyl on the wound bed with [facility] changing the dressing 3 times a week. She has also developed more superficial areas on the posterior and lateral part of the heel. Apparently remotely the patient had multiple toes removed on both feet secondary to infection. She is up walking active. I could see a culture [facility] on 9/16 that showed 2+ Proteus she apparently has received a course of Keflex. They are using a shoe for her with the heel cut out in the back which is appropriate for this reason I did not see the need to give her a heel offloading shoe. Past medical history; type 2 diabetes, parotid mass, carotid artery stenosis, pressure ulcer of the left heel, hypertension, chronic renal failure stage III We could not obtain a Doppler signal in her foot therefore we could not measure ABIs at the dorsalis pedis or posterior tibial on the left 10/7; patient we admitted the clinic 2 weeks ago. She lives [place] I believe an assisted living [place]. The area on her left lateral heel is closed. The area on the medial heel still a substantive  wound with some necrotic surface. We have been using Santyl but I am not exactly sure how often and were getting this changed. We changed to Iodoflex today because of this. 11/8; this is a patient who has not been here in a month and unfortunately I have lost track of her. The arterial studies that I ordered last visit were actually done but I do not think I have seen these. There were done on Tilden/12/21 at interventional radiology. Arterial studies; left resting ABI at 1.06 resting TBI was not obtained. There was a significant pressure decreased from the popliteal to the calf great toe pressure was not obtained. She had normal triphasic waveforms in the femoral artery, slightly dampened biphasic waveform in the popliteal artery and monophasic waveforms in the runoff vessels. The interpretation was that her ABIs could be falsely elevated in the setting of lower extremity arterial calcification. There is a significant pressure gradient drop off from the femoral to the runoff vessels as could be seen with bilateral superficial femoral artery stenosis. Consider CTA MRA or direct. The patient comes in today with her son. Apparently home health is coming once a week may be twice. We have been using Iodoflex to the deep punched out area on her left medial heel 04/11/2020 upon evaluation today patient's wound actually showed signs of improvement compared to last evaluation. She still has some slough noted in the base of the wound some callus around the edges I did perform a careful debridement today. 12/9; the patient's wound actually has come in in size still punched out and with some depth. We are  using Iodoflex. She has a CT angiogram next week as directed by Dr. Loreta Ave Electronic Signature(s) Signed: 04/26/2020 3:37:20 PM By: Baltazar Najjar MD Entered By: Baltazar Najjar on 04/26/2020 14:00:04 -------------------------------------------------------------------------------- Physical Exam  Details Patient Name: Date of Service: [name] 04/26/2020 1:30 PM Medical Record Number: [id number] Patient Account Number: [id number] Date of Birth/Sex: Treating RN: Jan 02, 1926 (86 y.o. [name] Primary Care Provider: Baltazar Najjar Other Clinician: Referring Provider: Treating Provider/Extender: Dyanne Iha in Treatment: 10 Constitutional Sitting or standing Blood Pressure is within target range for patient.. Pulse regular and within target range for patient.Marland Kitchen Respirations regular, non-labored and within target range.. Temperature is normal and within the target range for the patient.Marland Kitchen Appears in no distress. Cardiovascular Popliteal pulses not palpable. Pedal pulses are nonpalpable. Notes Wound exam; left medial heel. This is a deep punched out wound under illumination still some debris on the surface but overall not too bad. There is no purulence no tenderness. This does not go to bone Electronic Signature(s) Signed: 04/26/2020 3:37:20 PM By: Baltazar Najjar MD Entered By: Baltazar Najjar on 04/26/2020 14:01:12 -------------------------------------------------------------------------------- Physician Orders Details Patient Name: Date of Service: [name] 04/26/2020 1:30 PM Medical Record Number: [id number] Patient Account Number: [id number] Date of Birth/Sex: Treating RN: 1925-07-12 (86 y.o. [name], [name] Primary Care Provider: Baltazar Najjar Other Clinician: Referring Provider: Treating Provider/Extender: Dyanne Iha in Treatment: 10 Verbal / Phone Orders: No Diagnosis Coding Follow-up Appointments Return Appointment in 2 weeks. Bathing/ Shower/ Hygiene May shower with protection but do not get wound dressing(s) wet. Edema Control - Lymphedema / SCD / Other Elevate legs to the level of the heart or above for 30 minutes daily and/or when sitting, a frequency of: Avoid standing for long periods  of time. Wound Treatment Wound #1 - Calcaneus Wound Laterality: Left Cleanser: Wound Cleanser (Home Health) 2 x Per Week/30 Days Discharge Instructions: Cleanse the wound with wound cleanser prior to applying a clean dressing using gauze sponges, not tissue or cotton balls. Peri-Wound Care: Sween Lotion (Moisturizing lotion) (Home Health) 2 x Per Week/30 Days Discharge Instructions: Apply moisturizing lotion as directed Prim Dressing: IODOFLEX 0.9% Cadexomer Iodine Pad 4x6 cm (Home Health) 2 x Per Week/30 Days ary Discharge Instructions: Or Iodosorb. Apply to wound bed as instructed Secondary Dressing: Woven Gauze Sponge, Non-Sterile 4x4 in (Home Health) 2 x Per Week/30 Days Discharge Instructions: Apply over primary dressing as directed. Secondary Dressing: ABD Pad, 5x9 (Home Health) 2 x Per Week/30 Days Discharge Instructions: Apply over primary dressing as directed. Secured With: [employer], 4.5x3.1 (in/yd) (Home Health) 2 x Per Week/30 Days Discharge Instructions: Secure with Kerlix as directed. Secured With: Paper Tape, 2x10 (in/yd) (Home Health) 2 x Per Week/30 Days Discharge Instructions: Secure dressing with tape as directed. Electronic Signature(s) Signed: 04/26/2020 3:27:39 PM By: Fonnie Mu RN Signed: 04/26/2020 3:37:20 PM By: Baltazar Najjar MD Entered By: Fonnie Mu on 04/26/2020 13:58:20 -------------------------------------------------------------------------------- Problem List Details Patient Name: Date of Service: MA [name] 04/26/2020 1:30 PM Medical Record Number: [id number] Patient Account Number: [id number] Date of Birth/Sex: Treating RN: 04/20/1926 (86 y.o. 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 02/10/2020 No Yes  E11.51 Type 2 diabetes mellitus with diabetic peripheral angiopathy without gangrene 02/10/2020 No Yes E11.40 Type 2 diabetes mellitus with diabetic neuropathy, unspecified 02/10/2020 No Yes Inactive Problems ICD-10 Code Description Active Date Inactive Date L97.123 Non-pressure chronic ulcer of left thigh with necrosis of muscle 02/10/2020 02/10/2020 L97.421 Non-pressure chronic ulcer of left heel and midfoot limited to breakdown of skin 02/10/2020 02/10/2020 Resolved Problems Electronic Signature(s) Signed: 04/26/2020 3:37:20 PM By: Baltazar Najjar MD Entered By: Baltazar Najjar on 04/26/2020 13:58:47 -------------------------------------------------------------------------------- Progress Note Details Patient Name: Date of Service: [name] 04/26/2020 1:30 PM Medical Record Number: [id number] Patient Account Number: [id number] Date of Birth/Sex: Treating RN: 1925/12/15 (86 y.o. [name] Primary Care Provider: Baltazar Najjar Other Clinician: Referring Provider: Treating Provider/Extender: Dyanne Iha in Treatment: 10 Subjective History of Present Illness (HPI) ADMISSION 02/10/2020 This is a 86 year old woman who lives in [facility] which is an assisted living in [place]. She has home health which is surprising since she has [place] healthcare Medicaid. Nevertheless she has had 2-month history of a substantial wound on the left heel. This is in the setting of type 2 diabetes. From what I can tell they have been using Santyl on the wound bed with [facility] changing the dressing 3 times a week. She has also developed more superficial areas on the posterior and lateral part of the heel. Apparently remotely the patient had multiple toes removed on both feet secondary to infection. She is up  walking active. I could see a culture from [facility] on 9/16 that showed 2+ Proteus she apparently has received a course of Keflex. They are using a shoe for her with the heel cut out in the back which is appropriate for this reason I did not see the need to give her a heel offloading shoe. Past medical history; type 2 diabetes, parotid mass, carotid artery stenosis, pressure ulcer of the left heel, hypertension, chronic renal failure stage III We could not obtain a Doppler signal in her foot therefore we could not measure ABIs at the dorsalis pedis or posterior tibial on the left 10/7; patient we admitted the clinic 2 weeks ago. She lives then I believe an assisted living in [place]. The area on her left lateral heel is closed. The area on the medial heel still a substantive wound with some necrotic surface. We have been using Santyl but I am not exactly sure how often and were getting this changed. We changed to Iodoflex today because of this. 11/8; this is a patient who has not been here in a month and unfortunately I have lost track of her. The arterial studies that I ordered last visit were actually done but I do not think I have seen these. There were done on Tild[place]/12/21 at interventional radiology. Arterial studies; left resting ABI at 1.06 resting TBI was not obtained. There was a significant pressure decreased from the popliteal to the calf great toe pressure was not obtained. She had normal triphasic waveforms in the femoral artery, slightly dampened biphasic waveform in the popliteal artery and monophasic waveforms in the runoff vessels. The interpretation was that her ABIs could be falsely elevated in the setting of lower extremity arterial calcification. There is a significant pressure gradient drop off from the femoral to the runoff vessels as could be seen with bilateral superficial femoral artery stenosis. Consider CTA MRA or direct. The patient comes in today with her son.  Apparently home health is coming once  a week may be twice. We have been using Iodoflex to the deep punched out area on her left medial heel 04/11/2020 upon evaluation today patient's wound actually showed signs of improvement compared to last evaluation. She still has some slough noted in the base of the wound some callus around the edges I did perform a careful debridement today. 12/9; the patient's wound actually has come in in size still punched out and with some depth. We are using Iodoflex. She has a CT angiogram next week as directed by Dr. Loreta Ave Objective Constitutional Sitting or standing Blood Pressure is within target range for patient.. Pulse regular and within target range for patient.Marland Kitchen Respirations regular, non-labored and within target range.. Temperature is normal and within the target range for the patient.Marland Kitchen Appears in no distress. Vitals Time Taken: 1:39 PM, Height: 66 in, Weight: 140 lbs, BMI: 22.6, Temperature: 97.8 F, Pulse: 73 bpm, Respiratory Rate: 16 breaths/min, Blood Pressure: 144/83 mmHg. Cardiovascular Popliteal pulses not palpable. Pedal pulses are nonpalpable. General Notes: Wound exam; left medial heel. This is a deep punched out wound under illumination still some debris on the surface but overall not too bad. There is no purulence no tenderness. This does not go to bone Integumentary (Hair, Skin) Wound #1 status is Open. Original cause of wound was Gradually Appeared. The wound is located on the Left Calcaneus. The wound measures 2.5cm length x 1.6cm width x 1.2cm depth; 3.142cm^2 area and 3.77cm^3 volume. There is Fat Layer (Subcutaneous Tissue) exposed. There is no tunneling or undermining noted. There is a medium amount of serosanguineous drainage noted. The wound margin is distinct with the outline attached to the wound base. There is large (67-100%) pink granulation within the wound bed. There is a small (1-33%) amount of necrotic tissue within the wound  bed including Adherent Slough. 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 Bathing/ Shower/ Hygiene: May shower with protection but do not get wound dressing(s) wet. Edema Control - Lymphedema / SCD / Other: Elevate legs to the level of the heart or above for 30 minutes daily and/or when sitting, a frequency of: Avoid standing for long periods of time. WOUND #1: - Calcaneus Wound Laterality: Left Cleanser: Wound Cleanser (Home Health) 2 x Per Week/30 Days Discharge Instructions: Cleanse the wound with wound cleanser prior to applying a clean dressing using gauze sponges, not tissue or cotton balls. Peri-Wound Care: Sween Lotion (Moisturizing lotion) (Home Health) 2 x Per Week/30 Days Discharge Instructions: Apply moisturizing lotion as directed Prim Dressing: IODOFLEX 0.9% Cadexomer Iodine Pad 4x6 cm (Home Health) 2 x Per Week/30 Days ary Discharge Instructions: Or Iodosorb. Apply to wound bed as instructed Secondary Dressing: Woven Gauze Sponge, Non-Sterile 4x4 in (Home Health) 2 x Per Week/30 Days Discharge Instructions: Apply over primary dressing as directed. Secondary Dressing: ABD Pad, 5x9 (Home Health) 2 x Per Week/30 Days Discharge Instructions: Apply over primary dressing as directed. Secured With: [employer], 4.5x3.1 (in/yd) (Home Health) 2 x Per Week/30 Days Discharge Instructions: Secure with Kerlix as directed. Secured With: Paper T ape, 2x10 (in/yd) (Home Health) 2 x Per Week/30 Days Discharge Instructions: Secure dressing with tape as directed. 1. At this time I am going to continue the Iodoflex 2. After her CTA and especially  if we can get more blood flow  down to this area( question posterior tibial artery] then I may look at Dermagraft 3. The patient is not in a lot of pain which is fortunate Psychologist, prison and probation services) Signed: 04/26/2020 3:37:20 PM By: Baltazar Najjar MD Entered By: Baltazar Najjar on 04/26/2020 14:02:27 -------------------------------------------------------------------------------- SuperBill Details Patient Name: Date of Service: MA [name] 04/26/2020 Medical Record Number: [id number] Patient Account Number: [id number] Date of Birth/Sex: Treating RN: 1925/10/02 (86 y.o. 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 E11.51 Type 2 diabetes mellitus with diabetic peripheral angiopathy without gangrene E11.40 Type 2 diabetes mellitus with diabetic neuropathy, unspecified Facility Procedures The patient participates with Medicare or their insurance follows the Medicare Facility Guidelines: CPT4 Code Description Modifier Quantity [id number] [phone] - WOUND CARE VISIT-LEV 3 EST PT 1 Physician Procedures : CPT4 Code Description Modifier [id number] - WC PHYS LEVEL 3 - EST PT ICD-10 Diagnosis Description L97.423 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

## 2020-05-02 ENCOUNTER — Encounter: Payer: Self-pay | Admitting: Podiatry

## 2020-05-02 ENCOUNTER — Other Ambulatory Visit: Payer: Self-pay

## 2020-05-02 ENCOUNTER — Ambulatory Visit (INDEPENDENT_AMBULATORY_CARE_PROVIDER_SITE_OTHER): Payer: Medicare Other | Admitting: Podiatry

## 2020-05-02 DIAGNOSIS — L97422 Non-pressure chronic ulcer of left heel and midfoot with fat layer exposed: Secondary | ICD-10-CM | POA: Diagnosis not present

## 2020-05-03 ENCOUNTER — Other Ambulatory Visit: Payer: Self-pay | Admitting: *Deleted

## 2020-05-03 ENCOUNTER — Encounter: Payer: Self-pay | Admitting: Podiatry

## 2020-05-03 NOTE — Progress Notes (Signed)
Subjective:  Patient ID: Rebecca Walsh, female    DOB: January 27, 1926,  MRN: [id number]  Chief Complaint  Patient presents with  . Diabetic Ulcer    Left foot towards the heel has an open area that has been there for about 8 months    86 y.o. female presents for wound care.  Patient presents with complaint of left heel ulcer that has opened up to the area of the foot.  Patient states been about 8 months since that ulcer has been present.  They have been doing local wound care.  Patient was given local wound care done at the wound care center by Dr. Leanord Hawking.  However patient is following up with me to evaluate if there is anything that can be done surgically.  Patient is still getting her primary local wound care done at wound care center for which they are applying Iodosorb cream.  She has pretty significant peripheral artery disease with poor flow to the lower extremity.  I believe patient will benefit from vascular intervention.  I will discuss this case with Dr. Gilmer Mor see if there is anything that can be done for this.  Review of Systems: Negative except as noted in the HPI. Denies N/V/F/Ch.  History reviewed. No pertinent past medical history.  Current Outpatient Medications:  .  acetaminophen (TYLENOL) 500 MG tablet, Take by mouth., Disp: , Rfl:  .  alum & mag hydroxide-simeth (MAALOX/MYLANTA) 200-200-20 MG/5ML suspension, Take by mouth., Disp: , Rfl:  .  amLODipine-benazepril (LOTREL) 10-20 MG capsule, Take 1 capsule by mouth daily., Disp: , Rfl:  .  amoxicillin-clavulanate (AUGMENTIN) 500-125 MG tablet, Take 1 tablet by mouth 2 (two) times daily., Disp: , Rfl:  .  bismuth subsalicylate (PEPTO BISMOL) 262 MG/15ML suspension, Take by mouth., Disp: , Rfl:  .  cephALEXin (KEFLEX) 500 MG capsule, Take 500 mg by mouth 4 (four) times daily., Disp: , Rfl:  .  ciprofloxacin (CIPRO) 500 MG tablet, Take 500 mg by mouth 2 (two) times daily., Disp: , Rfl:  .  clopidogrel (PLAVIX) 75 MG tablet,  Take 75 mg by mouth daily., Disp: , Rfl:  .  SANTYL ointment, Apply topically., Disp: , Rfl:   Social History   Tobacco Use  Smoking Status Not on file  Smokeless Tobacco Not on file    Not on File Objective:  There were no vitals filed for this visit. There is no height or weight on file to calculate BMI. Constitutional Well developed. Well nourished.  Vascular Dorsalis pedis pulses non palpable bilaterally. Posterior tibial pulses non palpable bilaterally. Capillary refill normal to all digits.  No cyanosis or clubbing noted. Pedal hair growth normal.  Neurologic Normal speech. Oriented to person, place, and time. Protective sensation absent  Dermatologic Wound Location: Left heel with fat layer exposed Wound Base: Mixed Granular/Fibrotic Peri-wound: Calloused Exudate: Scant/small amount Serosanguinous exudate Wound Measurements: -See below  Orthopedic: No pain to palpation either foot.   Radiographs: None Assessment:   1. Heel ulcer, left, with fat layer exposed (HCC)    Plan:  Patient was evaluated and treated and all questions answered.  Ulcer left heel ulceration with fat layer exposed -Dressed with Betadine wet-to-dry, DSD. -Continue off-loading with surgical shoe. -I will defer debridement and local wound care to the wound care center primarily.  However I believe she will benefit from offloading shoe suggestions are surgical shoe.  Surgical shoe was dispensed -Given that she has poor flow to the lower extremity I believe she will benefit  from intervention and the location of it she is high risk of losing the limb.  I discussed with the patient that she will benefit from vascular intervention.  I referred her over to Dr. Gilmer Mor who can help with optimization of the vascular flow. -Continue local wound care with the wound care center  No follow-ups on file.

## 2020-05-08 ENCOUNTER — Other Ambulatory Visit (HOSPITAL_COMMUNITY): Payer: Self-pay | Admitting: Interventional Radiology

## 2020-05-08 DIAGNOSIS — L97529 Non-pressure chronic ulcer of other part of left foot with unspecified severity: Secondary | ICD-10-CM

## 2020-05-10 ENCOUNTER — Other Ambulatory Visit: Payer: Self-pay

## 2020-05-10 ENCOUNTER — Encounter (HOSPITAL_BASED_OUTPATIENT_CLINIC_OR_DEPARTMENT_OTHER): Payer: Medicare Other | Admitting: Internal Medicine

## 2020-05-10 DIAGNOSIS — E11621 Type 2 diabetes mellitus with foot ulcer: Secondary | ICD-10-CM | POA: Diagnosis not present

## 2020-05-10 NOTE — Progress Notes (Signed)
Rebecca Walsh ([id number]) Visit Report for 05/10/2020 HPI Details Patient Name: Date of Service: [name] 05/10/2020 12:30 PM Medical Record Number: [id number] Patient Account Number: [id number] Date of Birth/Sex: Treating RN: 1925/12/23 (86 y.o. Rebecca Walsh Primary Care Provider: Baltazar Najjar Other Clinician: Referring Provider: Treating Provider/Extender: Dyanne Iha in Treatment: 12 History of Present Illness HPI Description: ADMISSION 02/10/2020 This is a 86 year old woman who lives in [facility] which is an assisted living in [place]. She has home health which is surprising since she has [place] healthcare Medicaid. Nevertheless she has had 52-month history of a substantial wound on the left heel. This is in the setting of type 2 diabetes. From what I can tell they have been using Santyl on the wound bed with [facility] changing the dressing 3 times a week. She has also developed more superficial areas on the posterior and lateral part of the heel. Apparently remotely the patient had multiple toes removed on both feet secondary to infection. She is up walking active. I could see a culture from [facility] on 9/16 that showed 2+ Proteus she apparently has received a course of Keflex. They are using a shoe for her with the heel cut out in the back which is appropriate for this reason I did not see the need to give her a heel offloading shoe. Past medical history; type 2 diabetes, parotid mass, carotid artery stenosis, pressure ulcer of the left heel, hypertension, chronic renal failure stage III We could not obtain a Doppler signal in her foot therefore we could not measure ABIs at the dorsalis pedis or posterior tibial on the left 10/7; patient we admitted the clinic 2 weeks ago. She lives then I believe an assisted living in [place]. The area on her left lateral heel is closed. The area on the medial heel still a substantive  wound with some necrotic surface. We have been using Santyl but I am not exactly sure how often and were getting this changed. We changed to Iodoflex today because of this. 11/8; this is a patient who has not been here in a month and unfortunately I have lost track of her. The arterial studies that I ordered last visit were actually done but I do not think I have seen these. There were done on 02/28/20 at interventional radiology. Arterial studies; left resting ABI at 1.06 resting TBI was not obtained. There was a significant pressure decreased from the popliteal to the calf great toe pressure was not obtained. She had normal triphasic waveforms in the femoral artery, slightly dampened biphasic waveform in the popliteal artery and monophasic waveforms in the runoff vessels. The interpretation was that her ABIs could be falsely elevated in the setting of lower extremity arterial calcification. There is a significant pressure gradient drop off from the femoral to the runoff vessels as could be seen with bilateral superficial femoral artery stenosis. Consider CTA MRA or direct. The patient comes in today with her son. Apparently home health is coming once a week may be twice. We have been using Iodoflex to the deep punched out area on her left medial heel 04/11/2020 upon evaluation today patient's wound actually showed signs of improvement compared to last evaluation. She still has some slough noted in the base of the wound some callus around the edges I did perform a careful debridement today. 12/9; the patient's wound actually has come in in size still punched out and with some depth. We are  using Iodoflex. She has a CT angiogram next week as directed by Dr. Loreta Ave 12/23; the patient arrives today with wet-to-dry dressings which have been ordered by Dr. Allena Katz who she saw this week also I believe. I thought she had a CT angiogram booked last week with Dr. Loreta Ave as I think it turns out to be next week  and then a review to see if anything can be done from an endovascular point of view. The patient is not complaining of pain Electronic Signature(s) Signed: 05/10/2020 3:50:51 PM By: Baltazar Najjar MD Entered By: Baltazar Najjar on 05/10/2020 13:03:12 -------------------------------------------------------------------------------- Physical Exam Details Patient Name: Date of Service: [name] 05/10/2020 12:30 PM Medical Record Number: [id number] Patient Account Number: [id number] Date of Birth/Sex: Treating RN: 05/08/1926 (86 y.o. Rebecca Walsh Primary Care Provider: Baltazar Najjar Other Clinician: Referring Provider: Treating Provider/Extender: Dyanne Iha in Treatment: 12 Constitutional Sitting or standing Blood Pressure is within target range for patient.. Pulse regular and within target range for patient.Marland Kitchen Respirations regular, non-labored and within target range.. Temperature is normal and within the target range for the patient.Marland Kitchen Appears in no distress. Cardiovascular Pedal pulses are not palpable. Notes Wound exam; left medial heel there is a deep punched out area here still not the best looking surface but in general not too bad. I do not believe this is changed much. This does not go to bone or deep structures there is no surrounding infection Electronic Signature(s) Signed: 05/10/2020 3:50:51 PM By: Baltazar Najjar MD Entered By: Baltazar Najjar on 05/10/2020 13:04:26 -------------------------------------------------------------------------------- Physician Orders Details Patient Name: Date of Service: [name] 05/10/2020 12:30 PM Medical Record Number: [id number] Patient Account Number: [id number] Date of Birth/Sex: Treating RN: 01/03/1926 (86 y.o. 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 Allena Katz at [facility] and with [facility]. Patient to come back to wound center if Dr. Allena Katz refers you back here. Bathing/ Shower/ Hygiene May shower with protection but do not get wound dressing(s) wet. Edema Control - Lymphedema / SCD / Other Elevate legs to the level of the heart or above for 30 minutes daily and/or when sitting, a frequency of: Avoid standing for long periods of time. Home Health New wound care orders this week; continue Home Health for wound care. May utilize formulary equivalent dressing for wound treatment orders unless otherwise specified. - [facility] to change three times a week. Patient to follow up with Dr. Allena Katz. Wound Treatment Wound #1 - Calcaneus Wound Laterality: Left Cleanser: Wound Cleanser (Home Health) 3 x Per Week/30 Days Discharge Instructions: Cleanse the wound with wound cleanser prior to applying a clean dressing using gauze sponges, not tissue or cotton balls. Peri-Wound Care: Sween Lotion (Moisturizing lotion) (Home Health) 3 x Per Week/30 Days Discharge Instructions: Apply moisturizing lotion as directed Secondary Dressing: ABD Pad, 5x9 (Home Health) 3 x Per Week/30 Days Discharge Instructions: Apply over primary dressing as directed. Secured With: [employer], 4.5x3.1 (in/yd) (Home Health) 3 x Per Week/30 Days  Discharge Instructions: Secure with Kerlix as directed. Secured With: Paper Tape, 2x10 (in/yd) (Home Health) 3 x Per Week/30 Days Discharge Instructions: Secure dressing  with tape as directed. Electronic Signature(s) Signed: 05/10/2020 3:50:51 PM By: Baltazar Najjarobson, Arnold Depinto MD Signed: 05/10/2020 4:19:56 PM By: Shawn Stalleaton, Bobbi Entered By: Shawn Stalleaton, Bobbi on 05/10/2020 13:00:40 -------------------------------------------------------------------------------- Problem List Details Patient Name: Date of Service: [place] Rebecca BaloRTIN, CEO [place] 05/10/2020 12:30 PM Medical Record Number: [id number] Patient Account Number: [id number] Date of Birth/Sex: Treating RN: [birth year] (86 y.o. 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 02/10/2020 No Yes E11.51 Type 2 diabetes mellitus with diabetic peripheral angiopathy without gangrene 02/10/2020 No Yes E11.40 Type 2 diabetes mellitus with diabetic neuropathy, unspecified 02/10/2020 No Yes Inactive Problems ICD-10 Code Description Active Date Inactive Date L97.123 Non-pressure chronic ulcer of left thigh with necrosis of muscle 02/10/2020 02/10/2020 L97.421 Non-pressure chronic ulcer of left heel and midfoot limited to breakdown of skin 02/10/2020 02/10/2020 Resolved Problems Electronic Signature(s) Signed: 05/10/2020 3:50:51 PM By: Baltazar Najjarobson, Sundae Maners MD Entered By: Baltazar Najjarobson, Yuval Rubens on 05/10/2020 13:01:40 -------------------------------------------------------------------------------- Progress Note Details Patient Name: Date of Service: [place] Rebecca BaloRTIN, CEO [place] 05/10/2020 12:30 PM Medical Record Number: [id number] Patient Account Number: [id number] Date of Birth/Sex: Treating RN: [birth year] (86 y.o. Rebecca SilenceF) Deaton, Bobbi Primary Care Provider: Baltazar Najjarobson, Tangela Dolliver Other Clinician: Referring Provider: Treating  Provider/Extender: Dyanne Ihaobson, Haleema Vanderheyden, Kalysta Kneisley Weeks in Treatment: 12 Subjective History of Present Illness (HPI) ADMISSION 02/10/2020 This is a 86 year old woman who lives in [facility] which is an assisted living in [place]. She has home health which is surprising since she has [place] healthcare Medicaid. Nevertheless she has had 1534-month history of a substantial wound on the left heel. This is in the setting of type 2 diabetes. From what I can tell they have been using Santyl on the wound bed [facility] changing the dressing 3 times a week. She has also developed more superficial areas on the posterior and lateral part of the heel. Apparently remotely the patient had multiple toes removed on both feet secondary to infection. She is up walking active. I could see a culture [facility] on 9/16 that showed 2+ Proteus she apparently has received a course of Keflex. They are using a shoe for her with the heel cut out in the back which is appropriate for this reason I did not see the need to give her a heel offloading shoe. Past medical history; type 2 diabetes, parotid mass, carotid artery stenosis, pressure ulcer of the left heel, hypertension, chronic renal failure stage III We could not obtain a Doppler signal in her foot therefore we could not measure ABIs at the dorsalis pedis or posterior tibial on the left 10/7; patient we admitted the clinic 2 weeks ago. She lives then I believe an assisted living in [place]. The area on her left lateral heel is closed. The area on the medial heel still a substantive wound with some necrotic surface. We have been using Santyl but I am not exactly sure how often and were getting this changed. We changed to Iodoflex today because of this. 11/8; this is a patient who has not been here in a month and unfortunately I have lost track of her. The  arterial studies that I ordered last visit were actually done but I do not think I  have seen these. There were done on 02/28/20 at interventional radiology. Arterial studies; left resting ABI at 1.06 resting TBI was not obtained. There was a significant pressure decreased from the popliteal to the calf great toe pressure was not obtained. She had normal triphasic waveforms in the femoral artery, slightly dampened biphasic waveform in the popliteal artery and monophasic waveforms in the runoff vessels. The interpretation was that her ABIs could be falsely elevated in the setting of lower extremity arterial calcification. There is a significant pressure gradient drop off from the femoral to the runoff vessels as could be seen with bilateral superficial femoral artery stenosis. Consider CTA MRA or direct. The patient comes in today with her son. Apparently home health is coming once a week may be twice. We have been using Iodoflex to the deep punched out area on her left medial heel 04/11/2020 upon evaluation today patient's wound actually showed signs of improvement compared to last evaluation. She still has some slough noted in the base of the wound some callus around the edges I did perform a careful debridement today. 12/9; the patient's wound actually has come in in size still punched out and with some depth. We are using Iodoflex. She has a CT angiogram next week as directed by Dr. Loreta Ave 12/23; the patient arrives today with wet-to-dry dressings which have been ordered by Dr. Allena Katz who she saw this week also I believe. I thought she had a CT angiogram booked last week with Dr. Loreta Ave as I think it turns out to be next week and then a review to see if anything can be done from an endovascular point of view. The patient is not complaining of pain Objective Constitutional Sitting or standing Blood Pressure is within target range for patient.. Pulse regular and within target range for patient.Marland Kitchen Respirations regular, non-labored and within target range.. Temperature is normal and  within the target range for the patient.Marland Kitchen Appears in no distress. Vitals Time Taken: 12:42 PM, Height: 66 in, Weight: 140 lbs, BMI: 22.6, Temperature: 98.4 F, Pulse: 77 bpm, Respiratory Rate: 16 breaths/min, Blood Pressure: 116/69 mmHg. Cardiovascular Pedal pulses are not palpable. General Notes: Wound exam; left medial heel there is a deep punched out area here still not the best looking surface but in general not too bad. I do not believe this is changed much. This does not go to bone or deep structures there is no surrounding infection Integumentary (Hair, Skin) Wound #1 status is Open. Original cause of wound was Gradually Appeared. The wound is located on the Left Calcaneus. The wound measures 1.5cm length x 2.4cm width x 0.9cm depth; 2.827cm^2 area and 2.545cm^3 volume. There is Fat Layer (Subcutaneous Tissue) exposed. There is no tunneling or undermining noted. There is a medium amount of serosanguineous drainage noted. The wound margin is distinct with the outline attached to the wound base. There is large (67-100%) pink granulation within the wound bed. There is a small (1-33%) amount of necrotic tissue within the wound bed including Adherent Slough. 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 Allena Katz at [facility] and with [facility]. Patient  to come back to wound center if Dr. Allena Katz refers you back here. Bathing/ Shower/ Hygiene: May shower with protection but do not get wound dressing(s) wet. Edema Control - Lymphedema / SCD / Other: Elevate legs to the level of the heart or above for 30 minutes daily and/or when sitting, a  frequency of: Avoid standing for long periods of time. Home Health: New wound care orders this week; continue Home Health for wound care. May utilize formulary equivalent dressing for wound treatment orders unless otherwise specified. - [facility] to change three times a week. Patient to follow up with Dr. Allena Katz. WOUND #1: - Calcaneus Wound Laterality: Left Cleanser: Wound Cleanser (Home Health) 3 x Per Week/30 Days Discharge Instructions: Cleanse the wound with wound cleanser prior to applying a clean dressing using gauze sponges, not tissue or cotton balls. Peri-Wound Care: Sween Lotion (Moisturizing lotion) (Home Health) 3 x Per Week/30 Days Discharge Instructions: Apply moisturizing lotion as directed Secondary Dressing: ABD Pad, 5x9 (Home Health) 3 x Per Week/30 Days Discharge Instructions: Apply over primary dressing as directed. Secured With: [employer], 4.5x3.1 (in/yd) (Home Health) 3 x Per Week/30 Days Discharge Instructions: Secure with Kerlix as directed. Secured With: Paper T ape, 2x10 (in/yd) (Home Health) 3 x Per Week/30 Days Discharge Instructions: Secure dressing with tape as directed. #1 the family expressed frustration with the mixed orders we are giving between podiatry and ourselves. I am going to let them follow-up with Dr. Allena Katz. She has a revascularization attempt next week by Dr. Loreta Ave at interventional radiology I think starting with a CTA 2. I will not make any follow-up appointments for them here but will remain available should that be required. Hopefully with revascularization we can get to a point where she might respond in terms of healing this difficult wound. 3. She is in assisted living I have written directions for them to follow in terms of offloading this. Electronic Signature(s) Signed: 05/10/2020 3:50:51 PM By: Baltazar Najjar MD Entered By: Baltazar Najjar on 05/10/2020  13:05:47 -------------------------------------------------------------------------------- SuperBill Details Patient Name: Date of Service: [name] 05/10/2020 Medical Record Number: [id number] Patient Account Number: [id number] Date of Birth/Sex: Treating RN: 18-Dec-1925 (86 y.o. 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 E11.51 Type 2 diabetes mellitus with diabetic peripheral angiopathy without gangrene E11.40 Type 2 diabetes mellitus with diabetic neuropathy, unspecified Facility Procedures Physician Procedures : CPT4 Code Description Modifier [id number] - WC PHYS LEVEL 3 - EST PT ICD-10 Diagnosis Description 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

## 2020-05-10 NOTE — Progress Notes (Signed)
Rebecca, Walsh ([id number]) Visit Report for 05/10/2020 Arrival Information Details Patient Name: Date of Service: Rebecca Walsh 05/10/2020 12:30 PM Medical Record Number: [id number] Patient Account Number: [id number] Date of Birth/Sex: Treating RN: 01-31-1926 (86 y.o. Rebecca Walsh, [name] Primary Care Shaquela Weichert: Baltazar Najjar Other Clinician: Referring Rebecca Walsh: Treating Crit Obremski/Extender: Dyanne Iha in Treatment: 12 Visit Information History Since Last Visit Added or deleted any medications: No Patient Arrived: Ambulatory Any new allergies or adverse reactions: No Arrival Time: 12:42 Had a fall or experienced change in No Accompanied By: self activities of daily living that may affect Transfer Assistance: None risk of falls: Patient Identification Verified: Yes Signs or symptoms of abuse/neglect since last visito No Secondary Verification Process Completed: Yes Hospitalized since last visit: No Patient Requires Transmission-Based Precautions: No Implantable device outside of the clinic excluding No Patient Has Alerts: No cellular tissue based products placed in the center since last visit: Has Dressing in Place as Prescribed: Yes Pain Present Now: No Electronic Signature(s) Signed: 05/10/2020 3:41:52 PM By: Rebecca Walsh Entered By: Rebecca Walsh on 05/10/2020 12:42:33 -------------------------------------------------------------------------------- Clinic Level of Care Assessment Details Patient Name: Date of Service: [name] 05/10/2020 12:30 PM Medical Record Number: [id number] Patient Account Number: [id number] Date of Birth/Sex: Treating RN: November 06, 1925 (86 y.o. Rebecca Walsh, [name] Primary Care Lielle Vandervort: Baltazar Najjar Other Clinician: Referring Rebecca Walsh: Treating Rebecca Walsh/Extender: Dyanne Iha in Treatment: 12 Clinic Level of Care Assessment Items TOOL 4 Quantity Score X- 1 0 Use when only an EandM is  performed on FOLLOW-UP visit ASSESSMENTS - Nursing Assessment / Reassessment X- 1 10 Reassessment of Co-morbidities (includes updates in patient status) X- 1 5 Reassessment of Adherence to Treatment Plan ASSESSMENTS - Wound and Skin A ssessment / Reassessment X - Simple Wound Assessment / Reassessment - one wound 1 5 []  - 0 Complex Wound Assessment / Reassessment - multiple wounds X- 1 10 Dermatologic / Skin Assessment (not related to wound area) ASSESSMENTS - Focused Assessment X- 1 5 Circumferential Edema Measurements - multi extremities X- 1 10 Nutritional Assessment / Counseling / Intervention []  - 0 Lower Extremity Assessment (monofilament, tuning fork, pulses) []  - 0 Peripheral Arterial Disease Assessment (using hand held doppler) ASSESSMENTS - Ostomy and/or Continence Assessment and Care []  - 0 Incontinence Assessment and Management []  - 0 Ostomy Care Assessment and Management (repouching, etc.) PROCESS - Coordination of Care X - Simple Patient / Family Education for ongoing care 1 15 []  - 0 Complex (extensive) Patient / Family Education for ongoing care X- 1 10 Staff obtains , Records, T Results / Process Orders est X- 1 10 Staff telephones HHA, Nursing Homes / Clarify orders / etc []  - 0 Routine Transfer to another Facility (non-emergent condition) []  - 0 Routine Hospital Admission (non-emergent condition) []  - 0 New Admissions / / Ordering NPWT Apligraf, etc. , []  - 0 Emergency Hospital Admission (emergent condition) X- 1 10 Simple Discharge Coordination []  - 0 Complex (extensive) Discharge Coordination PROCESS - Special Needs []  - 0 Pediatric / Minor Patient Management []  - 0 Isolation Patient Management []  - 0 Hearing / Language / Visual special needs []  - 0 Assessment of Community assistance (transportation, D/C planning, etc.) []  - 0 Additional assistance / Altered mentation []  - 0 Support Surface(s) Assessment  (bed, cushion, seat, etc.) INTERVENTIONS - Wound Cleansing / Measurement X - Simple Wound Cleansing - one wound 1 5 []  - 0 Complex Wound Cleansing - multiple wounds X- 1 5  Wound Imaging (photographs - any number of wounds) []  - 0 Wound Tracing (instead of photographs) X- 1 5 Simple Wound Measurement - one wound []  - 0 Complex Wound Measurement - multiple wounds INTERVENTIONS - Wound Dressings X - Small Wound Dressing one or multiple wounds 1 10 []  - 0 Medium Wound Dressing one or multiple wounds []  - 0 Large Wound Dressing one or multiple wounds []  - 0 Application of Medications - topical []  - 0 Application of Medications - injection INTERVENTIONS - Miscellaneous []  - 0 External ear exam []  - 0 Specimen Collection (cultures, biopsies, blood, body fluids, etc.) []  - 0 Specimen(s) / Culture(s) sent or taken to Lab for analysis []  - 0 Patient Transfer (multiple staff / / Similar devices) []  - 0 Simple Staple / Suture removal (25 or less) []  - 0 Complex Staple / Suture removal (26 or more) []  - 0 Hypo / Hyperglycemic Management (close monitor of Blood Glucose) []  - 0 Ankle / Brachial Index (ABI) - do not check if billed separately X- 1 5 Vital Signs Has the patient been seen at the hospital within the last three years: Yes Total Score: 120 Level Of Care: New/Established - Level 4 Electronic Signature(s) Signed: 05/10/2020 4:19:56 PM By: Entered By: on 05/10/2020 13:10:04 -------------------------------------------------------------------------------- Encounter Discharge Information Details Patient Name: Date of Service: MA LA 05/10/2020 12:30 PM Medical Record Number: Patient Account Number: Date of Birth/Sex: Treating RN: 21-Sep-1925 (86 y.o. Primary Care Rebecca Walsh: Other Clinician: Referring Ryna Beckstrom: Treating Shayne Deerman/Extender: in Treatment: 12 Encounter Discharge Information Items Discharge Condition: Stable Ambulatory Status: Wheelchair Discharge Destination: Home Transportation: Private Auto Accompanied By: family Schedule Follow-up Appointment: No Clinical Summary of Care: Electronic Signature(s) Signed: 05/10/2020 4:19:56 PM By: 05/12/2020 Entered By: Shawn Stall on 05/10/2020 13:09:20 -------------------------------------------------------------------------------- Lower Extremity Assessment Details Patient Name: Date of Service: 05/12/2020 05/10/2020 12:30 PM Medical Record Number: 05/12/2020 Patient Account Number: [id number] Date of Birth/Sex: Treating RN: [birth year] (86 y.o. 99 Primary Care Arlander Gillen: [name] Other Clinician: Referring Esvin Hnat: Treating Ajayla Iglesias/Extender: Baltazar Najjar in Treatment: 12 Edema Assessment Assessed: Dyanne Iha: No] 05/12/2020: No] Edema: [Left: Ye] [Right: s] Calf Left: Right: Point of Measurement: 40 cm From Medial Instep 32 cm Ankle Left: Right: Point of Measurement: 10 cm From Medial Instep 22 cm Electronic Signature(s) Signed: 05/10/2020 3:12:05 PM By: Shawn Stall RN Entered By: 05/12/2020 on 05/10/2020 12:45:45 -------------------------------------------------------------------------------- Multi Wound Chart Details Patient Name: Date of Service: MA 05/12/2020 LA 05/10/2020 12:30 PM Medical Record Number: [id number] Patient Account Number: 02/14/1926 Date of Birth/Sex: Treating RN: 16-Feb-1926 (86 y.o. Baltazar Najjar Primary Care Alleene Stoy: Dyanne Iha Other Clinician: Referring Kimsey Demaree: Treating Giavanni Zeitlin/Extender: [name] in Treatment: 12 Vital Signs Height(in): 66 Pulse(bpm): 77 Weight(lbs): 140 Blood Pressure(mmHg): 116/69 Body Mass Index(BMI): 23 Temperature(F): 98.4 Respiratory Rate(breaths/min): 16 Photos: [1:No Photos Left Calcaneus]  [N/A:N/A N/A] Wound Location: [1:Gradually Appeared] [N/A:N/A] Wounding Event: [1:Diabetic Wound/Ulcer of the Lower] [N/A:N/A] Primary Etiology: [1:Extremity Hypertension, Type II Diabetes] [N/A:N/A] Comorbid History: [1:10/18/2019] [N/A:N/A] Date Acquired: [1:12] [N/A:N/A] Weeks of Treatment: [1:Open] [N/A:N/A] Wound Status: [1:1.5x2.4x0.9] [N/A:N/A] Measurements L x W x D (cm) [1:2.827] [N/A:N/A] A (cm) : rea [1:2.545] [N/A:N/A] Volume (cm) : [1:55.00%] [N/A:N/A] % Reduction in A rea: [1:66.20%] [N/A:N/A] % Reduction in Volume: [1:Grade 2] [N/A:N/A] Classification: [1:Medium] [N/A:N/A] Exudate A mount: [1:Serosanguineous] [N/A:N/A] Exudate Type: [1:red,  brown] [N/A:N/A] Exudate Color: [1:Distinct, outline attached] [N/A:N/A] Wound Margin: [1:Large (67-100%)] [N/A:N/A] Granulation A mount: [1:Pink] [N/A:N/A] Granulation Quality: [1:Small (1-33%)] [N/A:N/A] Necrotic A mount: [1:Fat Layer (Subcutaneous Tissue): Yes N/A] Exposed Structures: [1:Fascia: No Tendon: No Muscle: No Joint: No Bone: No Small (1-33%)] [N/A:N/A] Treatment Notes Electronic Signature(s) Signed: 05/10/2020 3:50:51 PM By: Baltazar Najjar MD Signed: 05/10/2020 4:19:56 PM By: Shawn Stall Entered By: Baltazar Najjar on 05/10/2020 13:01:46 -------------------------------------------------------------------------------- Multi-Disciplinary Care Plan Details Patient Name: Date of Service: [name] 05/10/2020 12:30 PM Medical Record Number: [id number] Patient Account Number: [id number] Date of Birth/Sex: Treating RN: 03/17/1926 (86 y.o. [name] Primary Care Huan Pollok: Baltazar Najjar Other Clinician: Referring Ramiro Pangilinan: Treating Emmaleah Meroney/Extender: Dyanne Iha in Treatment: 12 Active Inactive Electronic Signature(s) Signed: 05/10/2020 4:19:56 PM By: Shawn Stall Entered By: Shawn Stall on 05/10/2020  13:09:40 -------------------------------------------------------------------------------- Pain Assessment Details Patient Name: Date of Service: [name] 05/10/2020 12:30 PM Medical Record Number: [id number] Patient Account Number: [id number] Date of Birth/Sex: Treating RN: 11-18-1925 (86 y.o. Rebecca Walsh, [name] Primary Care Tanaysia Bhardwaj: Baltazar Najjar Other Clinician: Referring Kennadie Brenner: Treating Adabelle Griffiths/Extender: Dyanne Iha in Treatment: 12 Active Problems Location of Pain Severity and Description of Pain Patient Has Paino No Site Locations Pain Management and Medication Current Pain Management: Electronic Signature(s) Signed: 05/10/2020 3:41:52 PM By: Rebecca Walsh Signed: 05/10/2020 4:19:56 PM By: Shawn Stall Entered By: Rebecca Walsh on 05/10/2020 12:43:07 -------------------------------------------------------------------------------- Patient/Caregiver Education Details Patient Name: Date of Service: [name] 12/23/2021andnbsp12:30 PM Medical Record Number: [id number] Patient Account Number: [id number] Date of Birth/Gender: Treating RN: Sep 27, 1925 (86 y.o. [name] Primary Care Physician: Baltazar Najjar Other Clinician: Referring Physician: Treating Physician/Extender: Dyanne Iha in Treatment: 12 Education Assessment Education Provided To: Patient Education Topics Provided Wound/Skin Impairment: Handouts: Skin Care Do's and Dont's Methods: Explain/Verbal Responses: Reinforcements needed Electronic Signature(s) Signed: 05/10/2020 4:19:56 PM By: Shawn Stall Entered By: Shawn Stall on 05/10/2020 12:51:50 -------------------------------------------------------------------------------- Wound Assessment Details Patient Name: Date of Service: [name] 05/10/2020 12:30 PM Medical Record Number: [id number] Patient Account Number: [id number] Date of Birth/Sex: Treating  RN: January 11, 1926 (86 y.o. Freddy Finner Primary Care Azrael Huss: Baltazar Najjar Other Clinician: Referring Prakriti Carignan: Treating Lundynn Cohoon/Extender: Dyanne Iha in Treatment: 12 Wound Status Wound Number: 1 Primary Etiology: Diabetic Wound/Ulcer of the Lower Extremity Wound Location: Left Calcaneus Wound Status: Open Wounding Event: Gradually Appeared Comorbid History: Hypertension, Type II Diabetes Date Acquired: 10/18/2019 Weeks Of Treatment: 12 Clustered Wound: No Wound Measurements Length: (cm) 1.5 Width: (cm) 2.4 Depth: (cm) 0.9 Area: (cm) 2.827 Volume: (cm) 2.545 % Reduction in Area: 55% % Reduction in Volume: 66.2% Epithelialization: Small (1-33%) Tunneling: No Undermining: No Wound Description Classification: Grade 2 Wound Margin: Distinct, outline attached Exudate Amount: Medium Exudate Type: Serosanguineous Exudate Color: red, brown Foul Odor After Cleansing: No Slough/Fibrino Yes Wound Bed Granulation Amount: Large (67-100%) Exposed Structure Granulation Quality: Pink Fascia Exposed: No Necrotic Amount: Small (1-33%) Fat Layer (Subcutaneous Tissue) Exposed: Yes Necrotic Quality: Adherent Slough Tendon Exposed: No Muscle Exposed: No Joint Exposed: No Bone Exposed: No Electronic Signature(s) Signed: 05/10/2020 3:12:05 PM By: Yevonne Pax RN Entered By: Yevonne Pax on 05/10/2020 12:48:35 -------------------------------------------------------------------------------- Vitals Details Patient Name: Date of Service: [name] 05/10/2020 12:30 PM Medical Record Number: [id number] Patient Account Number: [id number] Date of Birth/Sex: Treating RN: [birth year] (86 y.o. [name] Primary Care Gaylia Kassel: Baltazar Najjar Other Clinician: Referring Martine Trageser: Treating Alnisa Hasley/Extender: Annamary Rummage  Weeks in Treatment: 12 Vital Signs Time Taken: 12:42 Temperature (F): 98.4 Height (in): 66 Pulse (bpm):  77 Weight (lbs): 140 Respiratory Rate (breaths/min): 16 Body Mass Index (BMI): 22.6 Blood Pressure (mmHg): 116/69 Reference Range: 80 - 120 mg / dl Electronic Signature(s) Signed: 05/10/2020 3:41:52 PM By: Rebecca Itoawkins, Destiny Entered By: Rebecca Itoawkins, Destiny on 05/10/2020 12:42:56

## 2020-05-14 ENCOUNTER — Other Ambulatory Visit: Payer: Self-pay | Admitting: Student

## 2020-05-15 ENCOUNTER — Other Ambulatory Visit (HOSPITAL_COMMUNITY): Payer: Self-pay | Admitting: Interventional Radiology

## 2020-05-15 ENCOUNTER — Observation Stay (HOSPITAL_COMMUNITY)
Admission: RE | Admit: 2020-05-15 | Discharge: 2020-05-15 | Disposition: A | Discharge status: Home / Self Care | Payer: Medicare Other | Source: Ambulatory Visit | Attending: Interventional Radiology | Admitting: Interventional Radiology

## 2020-05-15 ENCOUNTER — Other Ambulatory Visit: Payer: Self-pay

## 2020-05-15 DIAGNOSIS — E1151 Type 2 diabetes mellitus with diabetic peripheral angiopathy without gangrene: Secondary | ICD-10-CM | POA: Insufficient documentation

## 2020-05-15 DIAGNOSIS — L97529 Non-pressure chronic ulcer of other part of left foot with unspecified severity: Secondary | ICD-10-CM | POA: Insufficient documentation

## 2020-05-15 DIAGNOSIS — E1122 Type 2 diabetes mellitus with diabetic chronic kidney disease: Secondary | ICD-10-CM | POA: Diagnosis not present

## 2020-05-15 DIAGNOSIS — E11621 Type 2 diabetes mellitus with foot ulcer: Secondary | ICD-10-CM | POA: Diagnosis not present

## 2020-05-15 DIAGNOSIS — F0390 Unspecified dementia without behavioral disturbance: Secondary | ICD-10-CM | POA: Diagnosis not present

## 2020-05-15 DIAGNOSIS — Z7902 Long term (current) use of antithrombotics/antiplatelets: Secondary | ICD-10-CM | POA: Diagnosis not present

## 2020-05-15 DIAGNOSIS — Z7984 Long term (current) use of oral hypoglycemic drugs: Secondary | ICD-10-CM | POA: Diagnosis not present

## 2020-05-15 DIAGNOSIS — E785 Hyperlipidemia, unspecified: Secondary | ICD-10-CM | POA: Diagnosis not present

## 2020-05-15 DIAGNOSIS — I70245 Atherosclerosis of native arteries of left leg with ulceration of other part of foot: Secondary | ICD-10-CM | POA: Insufficient documentation

## 2020-05-15 DIAGNOSIS — N189 Chronic kidney disease, unspecified: Secondary | ICD-10-CM | POA: Diagnosis not present

## 2020-05-15 DIAGNOSIS — Z79899 Other long term (current) drug therapy: Secondary | ICD-10-CM | POA: Insufficient documentation

## 2020-05-15 DIAGNOSIS — S91309A Unspecified open wound, unspecified foot, initial encounter: Secondary | ICD-10-CM | POA: Insufficient documentation

## 2020-05-15 HISTORY — PX: IR US GUIDE VASC ACCESS LEFT: IMG2389

## 2020-05-15 HISTORY — PX: IR FEM POP ART ATHERECT INC PTA MOD SED: IMG2310

## 2020-05-15 HISTORY — PX: IR ANGIOGRAM EXTREMITY LEFT: IMG651

## 2020-05-15 LAB — PROTIME-INR
INR: 1 (ref 0.8–1.2)
Prothrombin Time: 13.2 seconds (ref 11.4–15.2)

## 2020-05-15 LAB — BASIC METABOLIC PANEL
Anion gap: 11 (ref 5–15)
BUN: 22 mg/dL (ref 8–23)
CO2: 24 mmol/L (ref 22–32)
Calcium: 9.2 mg/dL (ref 8.9–10.3)
Chloride: 104 mmol/L (ref 98–111)
Creatinine, Ser: 1.27 mg/dL — ABNORMAL HIGH (ref 0.44–1.00)
GFR, Estimated: 40 mL/min — ABNORMAL LOW (ref >60)
Glucose, Bld: 148 mg/dL — ABNORMAL HIGH (ref 70–99)
Potassium: 4.2 mmol/L (ref 3.5–5.1)
Sodium: 139 mmol/L (ref 135–145)

## 2020-05-15 LAB — CBC
HCT: 38.2 % (ref 36.0–46.0)
Hemoglobin: 11.6 g/dL — ABNORMAL LOW (ref 12.0–15.0)
MCH: 26.5 pg (ref 26.0–34.0)
MCHC: 30.4 g/dL (ref 30.0–36.0)
MCV: 87.4 fL (ref 80.0–100.0)
Platelets: 396 10*3/uL (ref 150–400)
RBC: 4.37 MIL/uL (ref 3.87–5.11)
RDW: 12.9 % (ref 11.5–15.5)
WBC: 9 10*3/uL (ref 4.0–10.5)
nRBC: 0 % (ref 0.0–0.2)

## 2020-05-15 MED ORDER — HEPARIN SODIUM (PORCINE) 1000 UNIT/ML IJ SOLN
INTRAMUSCULAR | Status: AC | PRN
  Administered 2020-05-15: 5000 [IU] via INTRAVENOUS

## 2020-05-15 MED ORDER — FENTANYL CITRATE (PF) 100 MCG/2ML IJ SOLN
INTRAMUSCULAR | Status: AC
  Filled 2020-05-15: qty 2

## 2020-05-15 MED ORDER — FENTANYL CITRATE (PF) 100 MCG/2ML IJ SOLN
INTRAMUSCULAR | Status: AC | PRN
  Administered 2020-05-15: 25 ug via INTRAVENOUS

## 2020-05-15 MED ORDER — IOHEXOL 300 MG/ML  SOLN
100.0000 mL | Freq: Once | INTRAMUSCULAR | Status: AC | PRN
  Administered 2020-05-15: 16:00:00 50 mL via INTRA_ARTERIAL

## 2020-05-15 MED ORDER — LIDOCAINE HCL 1 % IJ SOLN
INTRAMUSCULAR | Status: AC
  Filled 2020-05-15: qty 20

## 2020-05-15 MED ORDER — DIAZEPAM 5 MG PO TABS
ORAL_TABLET | ORAL | Status: AC
  Administered 2020-05-15: 14:00:00 5 mg via ORAL
  Filled 2020-05-15: qty 1

## 2020-05-15 MED ORDER — NITROGLYCERIN 1 MG/10 ML FOR IR/CATH LAB
INTRA_ARTERIAL | Status: AC | PRN
  Administered 2020-05-15 (×3): 200 ug via INTRA_ARTERIAL

## 2020-05-15 MED ORDER — LIDOCAINE HCL 1 % IJ SOLN
INTRAMUSCULAR | Status: AC | PRN
  Administered 2020-05-15: 10 mL

## 2020-05-15 MED ORDER — DIAZEPAM 5 MG PO TABS: 5.0000 mg | ORAL_TABLET | Freq: Once | ORAL | Status: AC

## 2020-05-15 MED ORDER — HEPARIN SODIUM (PORCINE) 1000 UNIT/ML IJ SOLN
INTRAMUSCULAR | Status: AC
  Filled 2020-05-15: qty 1

## 2020-05-15 MED ORDER — NITROGLYCERIN 1 MG/10 ML FOR IR/CATH LAB
INTRA_ARTERIAL | Status: AC
  Filled 2020-05-15: qty 10

## 2020-05-15 MED ORDER — SODIUM CHLORIDE 0.9 % IV SOLN
INTRAVENOUS | Status: DC
Start: 2020-05-15 — End: 2020-05-16

## 2020-05-15 MED ORDER — MIDAZOLAM HCL 2 MG/2ML IJ SOLN
INTRAMUSCULAR | Status: AC
  Filled 2020-05-15: qty 2

## 2020-05-15 NOTE — Discharge Instructions (Addendum)
Angiogram, Care After This sheet gives you information about how to care for yourself after your procedure. Your doctor may also give you more specific instructions. If you have problems or questions, contact your doctor. Follow these instructions at home: Insertion site care  Follow instructions from your doctor about how to take care of your long, thin tube (catheter) insertion area. Make sure you: ? Wash your hands with soap and water before you change your bandage (dressing). If you cannot use soap and water, use hand sanitizer. ? Change your bandage as told by your doctor. ? Leave stitches (sutures), skin glue, or skin tape (adhesive) strips in place. They may need to stay in place for 2 weeks or longer. If tape strips get loose and curl up, you may trim the loose edges. Do not remove tape strips completely unless your doctor says it is okay.  Do not take baths, swim, or use a hot tub until your doctor says it is okay.  You may shower 24-48 hours after the procedure or as told by your doctor. ? Gently wash the area with plain soap and water. ? Pat the area dry with a clean towel. ? Do not rub the area. This may cause bleeding.  Do not apply powder or lotion to the area. Keep the area clean and dry.  Check your insertion area every day for signs of infection. Check for: ? More redness, swelling, or pain. ? Fluid or blood. ? Warmth. ? Pus or a bad smell. Activity  Rest as told by your doctor, usually for 1-2 days.  Do not lift anything that is heavier than 10 lbs. (4.5 kg) or as told by your doctor.  Do not drive for 24 hours if you were given a medicine to help you relax (sedative).  Do not drive or use heavy machinery while taking prescription pain medicine. General instructions   Go back to your normal activities as told by your doctor, usually in about a week. Ask your doctor what activities are safe for you.  If the insertion area starts to bleed, lie flat and put  pressure on the area. If the bleeding does not stop, get help right away. This is an emergency.  Drink enough fluid to keep your pee (urine) clear or pale yellow.  Take over-the-counter and prescription medicines only as told by your doctor.  Keep all follow-up visits as told by your doctor. This is important. Contact a doctor if:  You have a fever.  You have chills.  You have more redness, swelling, or pain around your insertion area.  You have fluid or blood coming from your insertion area.  The insertion area feels warm to the touch.  You have pus or a bad smell coming from your insertion area.  You have more bruising around the insertion area.  Blood collects in the tissue around the insertion area (hematoma) that may be painful to the touch. Get help right away if:  You have a lot of pain in the insertion area.  The insertion area swells very fast.  The insertion area is bleeding, and the bleeding does not stop after holding steady pressure on the area.  The area near or just beyond the insertion area becomes pale, cool, tingly, or numb. These symptoms may be an emergency. Do not wait to see if the symptoms will go away. Get medical help right away. Call your local emergency services (911 in the [place] Do not drive yourself to the hospital.   Summary  After the procedure, it is common to have bruising and tenderness at the long, thin tube insertion area.  After the procedure, it is important to rest and drink plenty of fluids.  Do not take baths, swim, or use a hot tub until your doctor says it is okay to do so. You may shower 24-48 hours after the procedure or as told by your doctor.  If the insertion area starts to bleed, lie flat and put pressure on the area. If the bleeding does not stop, get help right away. This is an emergency. This information is not intended to replace advice given to you by your health care provider. Make sure you discuss any questions you have  with your health care provider. Document Revised: 04/17/2017 Document Reviewed: 04/29/2016 Elsevier Patient Education  2020 [employer] Moderate Conscious Sedation, Adult Sedation is the use of medicines to promote relaxation and relieve discomfort and anxiety. Moderate conscious sedation is a type of sedation. Under moderate conscious sedation, you are less alert than normal, but you are still able to respond to instructions, touch, or both. Moderate conscious sedation is used during short medical and dental procedures. It is milder than deep sedation, which is a type of sedation under which you cannot be easily woken up. It is also milder than general anesthesia, which is the use of medicines to make you unconscious. Moderate conscious sedation allows you to return to your regular activities sooner. Tell a health care provider about:  Any allergies you have.  All medicines you are taking, including vitamins, herbs, eye drops, creams, and over-the-counter medicines.  Use of steroids (by mouth or creams).  Any problems you or family members have had with sedatives and anesthetic medicines.  Any blood disorders you have.  Any surgeries you have had.  Any medical conditions you have, such as sleep apnea.  Whether you are pregnant or may be pregnant.  Any use of cigarettes, alcohol, marijuana, or street drugs. What are the risks? Generally, this is a safe procedure. However, problems may occur, including:  Getting too much medicine (oversedation).  Nausea.  Allergic reaction to medicines.  Trouble breathing. If this happens, a breathing tube may be used to help with breathing. It will be removed when you are awake and breathing on your own.  Heart trouble.  Lung trouble. What happens before the procedure? Staying hydrated Follow instructions from your health care provider about hydration, which may include:  Up to 2 hours before the procedure - you may continue to drink  clear liquids, such as water, clear fruit juice, black coffee, and plain tea. Eating and drinking restrictions Follow instructions from your health care provider about eating and drinking, which may include:  8 hours before the procedure - stop eating heavy meals or foods such as meat, fried foods, or fatty foods.  6 hours before the procedure - stop eating light meals or foods, such as toast or cereal.  6 hours before the procedure - stop drinking milk or drinks that contain milk.  2 hours before the procedure - stop drinking clear liquids. Medicine Ask your health care provider about:  Changing or stopping your regular medicines. This is especially important if you are taking diabetes medicines or blood thinners.  Taking medicines such as aspirin and ibuprofen. These medicines can thin your blood. Do not take these medicines before your procedure if your health care provider instructs you not to.  Tests and exams  You will have a physical   exam.  You may have blood tests done to show: ? How well your kidneys and liver are working. ? How well your blood can clot. General instructions  Plan to have someone take you home from the hospital or clinic.  If you will be going home right after the procedure, plan to have someone with you for 24 hours. What happens during the procedure?  An IV tube will be inserted into one of your veins.  Medicine to help you relax (sedative) will be given through the IV tube.  The medical or dental procedure will be performed. What happens after the procedure?  Your blood pressure, heart rate, breathing rate, and blood oxygen level will be monitored often until the medicines you were given have worn off.  Do not drive for 24 hours. This information is not intended to replace advice given to you by your health care provider. Make sure you discuss any questions you have with your health care provider. Document Revised: 04/17/2017 Document Reviewed:  08/25/2015 Elsevier Patient Education  2020 [employer]  

## 2020-05-15 NOTE — H&P (Signed)
Chief Complaint: Patient was seen in consultation today for left foot wound/LLE angiogram with possible intervention.  Referring Physician(s): Ricard Dillon (PCP)  Supervising Physician: Jacqulynn Cadet  Patient Status: [facility] - Out-pt  History of Present Illness: Rebecca Walsh is a 86 y.o. female with a past medical history of hyperlipidemia, CKD, diabetes mellitus, and dementia. She is known to IR- she was recently referred to [place] by her PCP for management of left foot wound, thought to be cause by critical limb ischemia. She met with Dr. Earleen Newport in clinic 04/05/2020 to discuss management options. At that time, patient and her family decided to pursue endovascular revascularization for management of left foot wound.  Patient presents today for possible image-guided LLE angiogram with possible revascularization. Patient awake and alert sitting in bed, pleasantly confused secondary to history of dementia- history difficult to obtain secondary to this.  Currently taking Plavix 75 mg once daily and Aspirin 81 mg once daily.   No past medical history on file.  Past Surgical History:  Procedure Laterality Date  . IR RADIOLOGIST EVAL & MGMT  04/05/2020    Allergies: Patient has no known allergies.  Medications: Prior to Admission medications   Medication Sig Start Date End Date Taking? Authorizing Provider  amLODipine-benazepril (LOTREL) 10-20 MG capsule Take 1 capsule by mouth daily. 03/19/20  Yes [provider]  clopidogrel (PLAVIX) 75 MG tablet Take 75 mg by mouth daily. 03/19/20  Yes [provider]  donepezil (ARICEPT) 5 MG tablet Take 5 mg by mouth at bedtime.   Yes [provider]  glipiZIDE-metformin (METAGLIP) 5-500 MG tablet Take 1 tablet by mouth 2 (two) times daily.   Yes [provider]  risperiDONE (RISPERDAL) 0.5 MG tablet Take 0.75 mg by mouth at bedtime.   Yes [provider]  simvastatin (ZOCOR) 20 MG tablet Take 20 mg  by mouth every evening.   Yes [provider]  traZODone (DESYREL) 50 MG tablet Take 50 mg by mouth at bedtime.   Yes [provider]  vitamin B-12 (CYANOCOBALAMIN) 100 MCG tablet Take 100 mcg by mouth daily.   Yes [provider]  acetaminophen (TYLENOL) 500 MG tablet Take 500 mg by mouth every 6 (six) hours as needed for moderate pain, fever or headache.    [provider]  alum & mag hydroxide-simeth (MAALOX/MYLANTA) 200-200-20 MG/5ML suspension Take 30 mLs by mouth every 6 (six) hours as needed for heartburn.    [provider]  guaifenesin (ROBITUSSIN) 100 MG/5ML syrup Take 200 mg by mouth every 6 (six) hours as needed for cough.    [provider]  loperamide (IMODIUM A-D) 2 MG tablet Take 2 mg by mouth as needed for diarrhea or loose stools.    [provider]  LORazepam (ATIVAN) 0.5 MG tablet Take 0.25 mg by mouth at bedtime.    [provider]  magnesium hydroxide (MILK OF MAGNESIA) 400 MG/5ML suspension Take 30 mLs by mouth at bedtime as needed for mild constipation.    [provider]  memantine (NAMENDA) 5 MG tablet Take 5 mg by mouth daily.    [provider]  Neomycin-Bacitracin-Polymyxin (TRIPLE ANTIBIOTIC) [phone] OINT Apply 1 application topically daily as needed (wound care).    [provider]     No family history on file.  Social History   Socioeconomic History  . Marital status: Single    Spouse name: Not on file  . Number of children: Not on file  . Years  of education: Not on file  . Highest education level: Not on file  Occupational History  . Not on file  Tobacco Use  . Smoking status: Not on file  . Smokeless tobacco: Not on file  Substance and Sexual Activity  . Alcohol use: Not on file  . Drug use: Not on file  . Sexual activity: Not on file  Other Topics Concern  . Not on file  Social History Narrative  . Not on file   Social Determinants of Health    Financial Resource Strain: Not on file  Food Insecurity: Not on file  Transportation Needs: Not on file  Physical Activity: Not on file  Stress: Not on file  Social Connections: Not on file     Review of Systems: A 12 point ROS discussed and pertinent positives are indicated in the HPI above.  All other systems are negative.  Review of Systems  Unable to perform ROS: Dementia    Vital Signs: BP 125/74 (BP Location: Left Arm)   Pulse 79   Temp 97.9 F (36.6 C) (Oral)   Resp 16   Ht _0  (1.6 m)   Wt 150 lb (68 kg)   SpO2 99%   BMI 26.57 kg/m   Physical Exam Vitals and nursing note reviewed.  Constitutional:      General: She is not in acute distress. Cardiovascular:     Rate and Rhythm: Normal rate and regular rhythm.     Heart sounds: Normal heart sounds. No murmur heard.   Pulmonary:     Effort: Pulmonary effort is normal. No respiratory distress.     Breath sounds: Normal breath sounds. No wheezing.  Skin:    General: Skin is warm and dry.  Neurological:     Mental Status: She is alert.     Comments: Pleasantly confused.      MD Evaluation Airway: WNL Heart: WNL Abdomen: WNL Chest/ Lungs: WNL ASA  Classification: 3 Mallampati/Airway Score: Two   Imaging: No results found.  Labs:  CBC: Recent Labs    05/15/20 1140  WBC 9.0  HGB 11.6*  HCT 38.2  PLT 396    COAGS: Recent Labs    05/15/20 1140  INR 1.0    BMP: Recent Labs    05/15/20 1140  NA 139  K 4.2  CL 104  CO2 24  GLUCOSE 148*  BUN 22  CALCIUM 9.2  CREATININE 1.27*  GFRNONAA 40*     Assessment and Plan:  Left foot wound, thought to be secondary to critical limb ischemia. Plan for image-guided LLE angiogram with possible intervention today in IR. Patient is NPO. Afebrile. OK to proceed with DAPT per IR protocol. INR 1.1 today.  Risks and benefits of LLE arteriogram with intervention were discussed with the patient including, but not limited to bleeding,  infection, vascular injury, contrast induced renal failure, stroke, reperfusion hemorrhage, or even death. This interventional procedure involves the use of X-rays and because of the nature of the planned procedure, it is possible that we will have prolonged use of X-ray fluoroscopy. Potential radiation risks to you include (but are not limited to) the following: - A slightly elevated risk for cancer  several years later in life. This risk is typically less than 0.5% percent. This risk is low in comparison to the normal incidence of human cancer, which is 33% for women and 50% for men according to the [employer]. - Radiation induced injury can include skin redness, resembling a  rash, tissue breakdown / ulcers and hair loss (which can be temporary or permanent).  The likelihood of either of these occurring depends on the difficulty of the procedure and whether you are sensitive to radiation due to previous procedures, disease, or genetic conditions.  IF your procedure requires a prolonged use of radiation, you will be notified and given written instructions for further action.  It is your responsibility to monitor the irradiated area for the 2 weeks following the procedure and to notify your physician if you are concerned that you have suffered a radiation induced injury.   All of the patient's son's questions were answered, he is agreeable to proceed. Consent obtained by patient's son, Rebecca Walsh, via telephone- signed and in chart.   Thank you for this interesting consult.  I greatly enjoyed meeting Rebecca Walsh and look forward to participating in their care.  A copy of this report was sent to the requesting provider on this date.  Electronically Signed: Earley Abide, PA-C 05/15/2020, 1:22 PM   I spent a total of 40 Minutes in face to face in clinical consultation, greater than 50% of which was counseling/coordinating care for left foot wound/LLE angiogram with possible  intervention.

## 2020-05-15 NOTE — Sedation Documentation (Signed)
Non sedation case.

## 2020-05-15 NOTE — Progress Notes (Signed)
Patient and son was given discharge instructions. Both verbalized understanding. 

## 2020-05-15 NOTE — Procedures (Signed)
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   Complications: None  Estimated Blood Loss: None  Recommendations: - Bedrest x 1 hr - DC home with son   Signed,  Sterling Big, MD

## 2020-05-16 ENCOUNTER — Ambulatory Visit: Payer: Medicare Other | Admitting: Podiatry

## 2020-05-28 ENCOUNTER — Other Ambulatory Visit: Payer: Self-pay | Admitting: Interventional Radiology

## 2020-05-28 DIAGNOSIS — S81802A Unspecified open wound, left lower leg, initial encounter: Secondary | ICD-10-CM

## 2020-05-31 ENCOUNTER — Encounter (HOSPITAL_BASED_OUTPATIENT_CLINIC_OR_DEPARTMENT_OTHER): Payer: Medicare Other | Attending: Internal Medicine | Admitting: Internal Medicine

## 2020-05-31 ENCOUNTER — Other Ambulatory Visit: Payer: Self-pay

## 2020-05-31 DIAGNOSIS — L97423 Non-pressure chronic ulcer of left heel and midfoot with necrosis of muscle: Secondary | ICD-10-CM | POA: Diagnosis not present

## 2020-05-31 DIAGNOSIS — I6529 Occlusion and stenosis of unspecified carotid artery: Secondary | ICD-10-CM | POA: Insufficient documentation

## 2020-05-31 DIAGNOSIS — E11621 Type 2 diabetes mellitus with foot ulcer: Secondary | ICD-10-CM | POA: Insufficient documentation

## 2020-05-31 DIAGNOSIS — E1122 Type 2 diabetes mellitus with diabetic chronic kidney disease: Secondary | ICD-10-CM | POA: Diagnosis not present

## 2020-05-31 DIAGNOSIS — I70244 Atherosclerosis of native arteries of left leg with ulceration of heel and midfoot: Secondary | ICD-10-CM | POA: Diagnosis not present

## 2020-05-31 DIAGNOSIS — N183 Chronic kidney disease, stage 3 unspecified: Secondary | ICD-10-CM | POA: Diagnosis not present

## 2020-05-31 DIAGNOSIS — E1151 Type 2 diabetes mellitus with diabetic peripheral angiopathy without gangrene: Secondary | ICD-10-CM | POA: Insufficient documentation

## 2020-05-31 DIAGNOSIS — I129 Hypertensive chronic kidney disease with stage 1 through stage 4 chronic kidney disease, or unspecified chronic kidney disease: Secondary | ICD-10-CM | POA: Insufficient documentation

## 2020-05-31 NOTE — Progress Notes (Signed)
Mickel FuchsMARTIN, Gracee ([id number]) Visit Report for 05/31/2020 HPI Details Patient Name: Date of Service: [place] Rebecca BaloRTIN, CEO [place] 05/31/2020 2:15 PM Medical Record Number: [id number] Patient Account Number: [id number] Date of Birth/Sex: Treating RN: 02-Jun-1925 (87 y.o. Arta SilenceF) Deaton, Bobbi Primary Care Provider: Baltazar Najjarobson, Jayzon Taras Other Clinician: Referring Provider: Treating Provider/Extender: Dyanne Ihaobson, Jarred Purtee, Arieal Cuoco Weeks in Treatment: 15 History of Present Illness HPI Description: ADMISSION 02/10/2020 This is a 87 year old woman who lives in [facility] which is an assisted living in [place]. She has home health which is surprising since she has [place]. Nevertheless she has had 6165-month history of a substantial wound on the left heel. This is in the setting of type 2 diabetes. From what I can tell they have been using Santyl on the wound bed [facility] changing the dressing 3 times a week. She has also developed more superficial areas on the posterior and lateral part of the heel. Apparently remotely the patient had multiple toes removed on both feet secondary to infection. She is up walking active. I could see a culture [facility] on [facility]/16 that showed 2+ Proteus she apparently has received a course of Keflex. They are using a shoe for her with the heel cut out in the back which is appropriate for this reason I did not see the need to give her a heel offloading shoe. Past medical history; type 2 diabetes, parotid mass, carotid artery stenosis, pressure ulcer of the left heel, hypertension, chronic renal failure stage III We could not obtain a Doppler signal in her foot therefore we could not measure ABIs at the dorsalis pedis or posterior tibial on the left 10/7; patient we admitted the clinic 2 weeks ago. She lives then I believe an assisted living in [place]. The area on her left lateral heel is closed. The area on the medial heel still a substantive  wound with some necrotic surface. We have been using Santyl but I am not exactly sure how often and were getting this changed. We changed to Iodoflex today because of this. 11/8; this is a patient who has not been here in a month and unfortunately I have lost track of her. The arterial studies that I ordered last visit were actually done but I do not think I have seen these. There were done on 02/28/20 at interventional radiology. Arterial studies; left resting ABI at 1.06 resting TBI was not obtained. There was a significant pressure decreased from the popliteal to the calf great toe pressure was not obtained. She had normal triphasic waveforms in the femoral artery, slightly dampened biphasic waveform in the popliteal artery and monophasic waveforms in the runoff vessels. The interpretation was that her ABIs could be falsely elevated in the setting of lower extremity arterial calcification. There is a significant pressure gradient drop off from the femoral to the runoff vessels as could be seen with bilateral superficial femoral artery stenosis. Consider CTA MRA or direct. The patient comes in today with her son. Apparently home health is coming once a week may be twice. We have been using Iodoflex to the deep punched out area on her left medial heel 04/11/2020 upon evaluation today patient's wound actually showed signs of improvement compared to last evaluation. She still has some slough noted in the base of the wound some callus around the edges I did perform a careful debridement today. 12/9; the patient's wound actually has come in in size still punched out and with some depth. We are  using Iodoflex. She has a CT angiogram next week as directed by Dr. Loreta Ave 12/23; the patient arrives today with wet-to-dry dressings which have been ordered by Dr. Allena Katz who she saw this week also I believe. I thought she had a CT angiogram booked last week with Dr. Loreta Ave as I think it turns out to be next week  and then a review to see if anything can be done from an endovascular point of view. 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 On 12/28 she underwent an angiogram with atherectomy and DCB of the high-grade popliteal stenosis this was done by Dr. Archer Asa. Apparently she tolerated this well. Her wound on the left medial heel has improved I was shown a dry scabbed area on the right plantar heel there is nothing open here. Electronic Signature(s) Signed: 05/31/2020 5:01:02 PM By: Baltazar Najjar MD Entered By: Baltazar Najjar on 05/31/2020 15:32:55 -------------------------------------------------------------------------------- Physical Exam Details Patient Name: Date of Service: [name] 05/31/2020 2:15 PM Medical Record Number: [id number] Patient Account Number: [id number] Date of Birth/Sex: Treating RN: 1926-01-14 (87 y.o. [name] Primary Care Provider: Baltazar Najjar Other Clinician: Referring Provider: Treating Provider/Extender: Dyanne Iha in Treatment: 15 Constitutional Sitting or standing Blood Pressure is within target range for patient.. Pulse regular and within target range for patient.Marland Kitchen Respirations regular, non-labored and within target range.. Temperature is normal and within the target range for the patient.Marland Kitchen Appears in no distress. Cardiovascular Still very difficult to feel pulses in either foot. Notes Wound exam; left medial heel I think this is closed and somewhat. Surface of the wound looks healthy we have been using Iodoflex. Right foot has no open wound however there is a's callus on the heel slight bogginess. I wonder if she is putting excessive pressure on this area Electronic Signature(s) Signed: 05/31/2020 5:01:02 PM By: Baltazar Najjar MD Entered By: Baltazar Najjar on 05/31/2020  15:37:56 -------------------------------------------------------------------------------- Physician Orders Details Patient Name: Date of Service: [name] 05/31/2020 2:15 PM Medical Record Number: [id number] Patient Account Number: [id number] Date of Birth/Sex: Treating RN: Apr 22, 1926 (87 y.o. 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 Bathing/ Shower/ Hygiene May shower with protection but do not get wound dressing(s) wet. Edema Control - Lymphedema / SCD / Other Elevate legs to the level of the heart or above for 30 minutes daily and/or when sitting, a frequency of: Avoid standing for long periods of time. Moisturize legs daily. - lotion both legs. Off-Loading Other: - float heels with pillows to offload while in bed. Home Health New wound care orders this week; continue Home Health for wound care. May utilize formulary equivalent dressing for wound treatment orders unless otherwise specified. - [facility] to change three times a week. On the week patient to wound center twice a week. Wound Treatment Wound #1 - Calcaneus Wound Laterality: Left Cleanser: Wound Cleanser (Home Health) 2 x Per Week/30 Days Discharge Instructions: Cleanse the wound with wound cleanser prior to applying a clean dressing  using gauze sponges, not tissue or cotton balls. Peri-Wound Care: Sween Lotion (Moisturizing lotion) (Home Health) 2 x Per  Week/30 Days Discharge Instructions: Apply moisturizing lotion as directed Prim Dressing: IODOFLEX 0.9% Cadexomer Iodine Pad 4x6 cm (Home Health) 2 x Per Week/30 Days ary Discharge Instructions: Or Iodosorb. Apply to wound bed as instructed Secondary Dressing: Woven Gauze Sponge, Non-Sterile 4x4 in (Home Health) 2 x Per Week/30 Days Discharge Instructions: Apply over primary dressing as directed. Secondary Dressing: ABD Pad, 5x9 (Home Health) 2 x Per Week/30 Days Discharge Instructions: Apply over primary dressing as directed. Secured With: [employer] Roll Sterile, 4.5x3.1 (in/yd) (Home Health) 2 x Per Week/30 Days Discharge Instructions: Secure with Kerlix as directed. Secured With: Paper Tape, 2x10 (in/yd) (Home Health) 2 x Per Week/30 Days Discharge Instructions: Secure dressing with tape as directed. Electronic Signature(s) Signed: 05/31/2020 5:01:02 PM By: Baltazar Najjarobson, Andrez Lieurance MD Signed: 05/31/2020 5:39:22 PM By: Shawn Stalleaton, Bobbi Entered By: Shawn Stalleaton, Bobbi on 05/31/2020 15:09:11 -------------------------------------------------------------------------------- Problem List Details Patient Name: Date of Service: [place] Rebecca BaloRTIN, CEO [place] 05/31/2020 2:15 PM Medical Record Number: [id number] Patient Account Number: [id number] Date of Birth/Sex: Treating RN: [birth year] (87 y.o. 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 02/10/2020 No Yes E11.51 Type 2 diabetes mellitus with diabetic peripheral angiopathy without gangrene 02/10/2020 No Yes E11.40 Type 2 diabetes mellitus with diabetic neuropathy, unspecified 02/10/2020 No Yes Inactive  Problems ICD-10 Code Description Active Date Inactive Date L97.123 Non-pressure chronic ulcer of left thigh with necrosis of muscle 02/10/2020 02/10/2020 L97.421 Non-pressure chronic ulcer of left heel and midfoot limited to breakdown of skin 02/10/2020 02/10/2020 Resolved Problems Electronic Signature(s) Signed: 05/31/2020 5:01:02 PM By: Baltazar Najjarobson, Raniya Golembeski MD Entered By: Baltazar Najjarobson, Constantin Hillery on 05/31/2020 15:31:24 -------------------------------------------------------------------------------- Progress Note Details Patient Name: Date of Service: MA Rebecca BaloRTIN, CEO [place] 05/31/2020 2:15 PM Medical Record Number: [id number] Patient Account Number: [id number] Date of Birth/Sex: Treating RN: [birth year] (87 y.o. Arta SilenceF) Deaton, Bobbi Primary Care Provider: Baltazar Najjarobson, Ewin Rehberg Other Clinician: Referring Provider: Treating Provider/Extender: Dyanne Ihaobson, Takeysha Bonk, Vonnie Spagnolo Weeks in Treatment: 15 Subjective History of Present Illness (HPI) ADMISSION 02/10/2020 This is a 87 year old woman who lives in [facility] which [place] an assisted living [place]. She has home health which is surprising since she has [place] Medicaid. Nevertheless she has had 5452-month history of a substantial wound on the left heel. This is in the setting of type 2 diabetes. From what I can tell they have been using Santyl on the wound bed [facility] changing the dressing 3 times a week. She has also developed more superficial areas on the posterior and lateral part of the heel. Apparently remotely the patient had multiple toes removed on both feet secondary to infection. She is up walking active. I could see a culture [facility] on 9/16 that showed 2+ Proteus she apparently has received a course of Keflex. They are using a shoe for her with the heel cut out in the back which is appropriate for this reason I did not see the need to give her a heel offloading shoe. Past medical history; type 2 diabetes, parotid  mass, carotid artery stenosis, pressure ulcer of the left heel, hypertension, chronic renal failure stage III We could not obtain  a Doppler signal in her foot therefore we could not measure ABIs at the dorsalis pedis or posterior tibial on the left 10/7; patient we admitted the clinic 2 weeks ago. She lives then I believe an assisted living in [place]. The area on her left lateral heel is closed. The area on the medial heel still a substantive wound with some necrotic surface. We have been using Santyl but I am not exactly sure how often and were getting this changed. We changed to Iodoflex today because of this. 11/8; this is a patient who has not been here in a month and unfortunately I have lost track of her. The arterial studies that I ordered last visit were actually done but I do not think I have seen these. There were done on 02/28/20 at interventional radiology. Arterial studies; left resting ABI at 1.06 resting TBI was not obtained. There was a significant pressure decreased from the popliteal to the calf great toe pressure was not obtained. She had normal triphasic waveforms in the femoral artery, slightly dampened biphasic waveform in the popliteal artery and monophasic waveforms in the runoff vessels. The interpretation was that her ABIs could be falsely elevated in the setting of lower extremity arterial calcification. There is a significant pressure gradient drop off from the femoral to the runoff vessels as could be seen with bilateral superficial femoral artery stenosis. Consider CTA MRA or direct. The patient comes in today with her son. Apparently home health is coming once a week may be twice. We have been using Iodoflex to the deep punched out area on her left medial heel 04/11/2020 upon evaluation today patient's wound actually showed signs of improvement compared to last evaluation. She still has some slough noted in the base of the wound some callus around the edges I did  perform a careful debridement today. 12/9; the patient's wound actually has come in in size still punched out and with some depth. We are using Iodoflex. She has a CT angiogram next week as directed by Dr. Loreta Ave 12/23; the patient arrives today with wet-to-dry dressings which have been ordered by Dr. Allena Katz who she saw this week also I believe. I thought she had a CT angiogram booked last week with Dr. Loreta Ave as I think it turns out to be next week and then a review to see if anything can be done from an endovascular point of view. 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 On 12/28 she underwent an angiogram with atherectomy and DCB of the high-grade popliteal stenosis this was done by Dr. Archer Asa. Apparently she tolerated this well. Her wound on the left medial heel has improved I was shown a dry scabbed area on the right plantar heel there is nothing open here. Objective Constitutional Sitting or standing Blood Pressure is within target range for patient.. Pulse regular and within target range for patient.Marland Kitchen Respirations regular, non-labored and within target range.. Temperature is normal and within the target range for the patient.Marland Kitchen Appears in no distress. Vitals Time Taken: 2:20 PM, Height: 66 in, Weight: 140 lbs, BMI: 22.6, Temperature: 98.6 F, Pulse: 74 bpm, Respiratory Rate: 16 breaths/min, Blood Pressure: 134/77 mmHg. Cardiovascular Still very difficult to feel pulses in either foot. General Notes: Wound exam; left medial heel I think this is closed and somewhat. Surface of the wound looks healthy we have been using Iodoflex. oo Right foot has no open wound however there is a's callus  on the heel slight bogginess. I wonder if she is putting excessive pressure on this area Integumentary (Hair, Skin) Wound #1 status is Open. Original cause of wound was Gradually Appeared. The wound is located on the Left Calcaneus.  The wound measures 1cm length x 1.8cm width x 0.2cm depth; 1.414cm^2 area and 0.283cm^3 volume. There is Fat Layer (Subcutaneous Tissue) exposed. There is no tunneling or undermining noted. There is a medium amount of serosanguineous drainage noted. The wound margin is distinct with the outline attached to the wound base. There is medium (34-66%) pink granulation within the wound bed. There is a medium (34-66%) amount of necrotic tissue within the wound bed including Adherent Slough. 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 Bathing/ Shower/ Hygiene: May shower with protection but do not get wound dressing(s) wet. Edema Control - Lymphedema / SCD / Other: Elevate legs to the level of the heart or above for 30 minutes daily and/or when sitting, a frequency of: Avoid standing for long periods of time. Moisturize legs daily. - lotion both legs. Off-Loading: Other: - float heels with pillows to offload while in bed. Home Health: New wound care orders this week; continue Home Health for wound care. May utilize formulary equivalent dressing for wound treatment orders unless otherwise specified. - [facility] to change three times a week. On the week patient to wound center twice a week. WOUND #1: - Calcaneus Wound Laterality: Left Cleanser: Wound Cleanser (Home Health) 2 x Per Week/30 Days Discharge Instructions: Cleanse the wound with wound cleanser prior to applying a clean dressing using gauze sponges, not tissue or cotton balls. Peri-Wound Care: Sween Lotion (Moisturizing lotion) (Home Health) 2 x Per Week/30 Days Discharge Instructions: Apply moisturizing lotion as  directed Prim Dressing: IODOFLEX 0.9% Cadexomer Iodine Pad 4x6 cm (Home Health) 2 x Per Week/30 Days ary Discharge Instructions: Or Iodosorb. Apply to wound bed as instructed Secondary Dressing: Woven Gauze Sponge, Non-Sterile 4x4 in (Home Health) 2 x Per Week/30 Days Discharge Instructions: Apply over primary dressing as directed. Secondary Dressing: ABD Pad, 5x9 (Home Health) 2 x Per Week/30 Days Discharge Instructions: Apply over primary dressing as directed. Secured With: [employer], 4.5x3.1 (in/yd) (Home Health) 2 x Per Week/30 Days Discharge Instructions: Secure with Kerlix as directed. Secured With: Paper T ape, 2x10 (in/yd) (Home Health) 2 x Per Week/30 Days Discharge Instructions: Secure dressing with tape as directed. 1. We continued with the Iodoflex on the left heel 2. I cautioned to relieve the pressure on her bilateral heels perhaps with a pillow at night. She is in an assisted living facility in [place]. 3. Severe PAD status post high-grade popliteal artery stenosis with atherectomy and DCB Electronic Signature(s) Signed: 05/31/2020 5:01:02 PM By: Baltazar Najjar MD Entered By: Baltazar Najjar on 05/31/2020 15:40:54 -------------------------------------------------------------------------------- SuperBill Details Patient Name: Date of Service: [name] 05/31/2020 Medical Record Number: [id number] Patient Account Number: [id number] Date of Birth/Sex: Treating RN: December 10, 1925 (87 y.o. 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 E11.51  Type 2 diabetes mellitus with diabetic peripheral angiopathy without gangrene E11.40 Type 2 diabetes mellitus with  diabetic neuropathy, unspecified Facility Procedures The patient participates with Medicare or their insurance follows the Medicare Facility Guidelines: CPT4 Code Description Modifier Quantity [id number] [phone] - WOUND CARE VISIT-LEV 4 EST PT 1 Physician Procedures : CPT4 Code Description Modifier [id number] - WC PHYS LEVEL 3 - EST PT ICD-10 Diagnosis Description 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

## 2020-05-31 NOTE — Progress Notes (Signed)
JLA, REYNOLDS ([id number]) Visit Report for 05/31/2020 Arrival Information Details Patient Name: Date of Service: Rebecca Walsh Rebecca Walsh 05/31/2020 2:15 PM Medical Record Number: [id number] Patient Account Number: [id number] Date of Birth/Sex: Treating RN: 1925/08/11 (87 y.o. Dorthula Perfect, Emeterio Reeve Primary Care Sanna Porcaro: Baltazar Najjar Other Clinician: Referring Caria Transue: Treating Reeva Davern/Extender: Dyanne Iha in Treatment: 15 Visit Information History Since Last Visit All ordered tests and consults were completed: Yes Patient Arrived: Wheel Chair Added or deleted any medications: No Arrival Time: 14:19 Any new allergies or adverse reactions: No Accompanied By: daughter-in-law Had a fall or experienced change in No Transfer Assistance: None activities of daily living that may affect Patient Identification Verified: Yes risk of falls: Secondary Verification Process Completed: Yes Signs or symptoms of abuse/neglect since last visito No Patient Requires Transmission-Based Precautions: No Hospitalized since last visit: No Patient Has Alerts: No Implantable device outside of the clinic excluding No cellular tissue based products placed in the center since last visit: Has Dressing in Place as Prescribed: Yes Pain Present Now: No Electronic Signature(s) Signed: 05/31/2020 5:28:50 PM By: Zandra Abts RN, BSN Entered By: Zandra Abts on 05/31/2020 14:20:05 -------------------------------------------------------------------------------- Clinic Level of Care Assessment Details Patient Name: Date of Service: [name] 05/31/2020 2:15 PM Medical Record Number: [id number] Patient Account Number: [id number] Date of Birth/Sex: Treating RN: 1925-07-27 (87 y.o. [name], [name] Primary Care Briget Shaheed: Baltazar Najjar Other Clinician: Referring Shawnae Leiva: Treating Mithra Spano/Extender: Dyanne Iha in Treatment: 15 Clinic Level of Care Assessment  Items TOOL 4 Quantity Score X- 1 0 Use when only an EandM is performed on FOLLOW-UP visit ASSESSMENTS - Nursing Assessment / Reassessment X- 1 10 Reassessment of Co-morbidities (includes updates in patient status) X- 1 5 Reassessment of Adherence to Treatment Plan ASSESSMENTS - Wound and Skin A ssessment / Reassessment X - Simple Wound Assessment / Reassessment - one wound 1 5 []  - 0 Complex Wound Assessment / Reassessment - multiple wounds X- 1 10 Dermatologic / Skin Assessment (not related to wound area) ASSESSMENTS - Focused Assessment X- 1 5 Circumferential Edema Measurements - multi extremities X- 1 10 Nutritional Assessment / Counseling / Intervention []  - 0 Lower Extremity Assessment (monofilament, tuning fork, pulses) []  - 0 Peripheral Arterial Disease Assessment (using hand held doppler) ASSESSMENTS - Ostomy and/or Continence Assessment and Care []  - 0 Incontinence Assessment and Management []  - 0 Ostomy Care Assessment and Management (repouching, etc.) PROCESS - Coordination of Care X - Simple Patient / Family Education for ongoing care 1 15 []  - 0 Complex (extensive) Patient / Family Education for ongoing care X- 1 10 Staff obtains , Records, T Results / Process Orders est X- 1 10 Staff telephones HHA, Nursing Homes / Clarify orders / etc []  - 0 Routine Transfer to another Facility (non-emergent condition) []  - 0 Routine Hospital Admission (non-emergent condition) []  - 0 New Admissions / / Ordering NPWT Apligraf, etc. , []  - 0 Emergency Hospital Admission (emergent condition) X- 1 10 Simple Discharge Coordination []  - 0 Complex (extensive) Discharge Coordination PROCESS - Special Needs []  - 0 Pediatric / Minor Patient Management []  - 0 Isolation Patient Management []  - 0 Hearing / Language / Visual special needs []  - 0 Assessment of Community assistance (transportation, D/C planning, etc.) []  - 0 Additional  assistance / Altered mentation []  - 0 Support Surface(s) Assessment (bed, cushion, seat, etc.) INTERVENTIONS - Wound Cleansing / Measurement X - Simple Wound Cleansing - one wound 1 5 []  -  0 Complex Wound Cleansing - multiple wounds X- 1 5 Wound Imaging (photographs - any number of wounds) []  - 0 Wound Tracing (instead of photographs) X- 1 5 Simple Wound Measurement - one wound []  - 0 Complex Wound Measurement - multiple wounds INTERVENTIONS - Wound Dressings []  - 0 Small Wound Dressing one or multiple wounds X- 1 15 Medium Wound Dressing one or multiple wounds []  - 0 Large Wound Dressing one or multiple wounds []  - 0 Application of Medications - topical []  - 0 Application of Medications - injection INTERVENTIONS - Miscellaneous []  - 0 External ear exam []  - 0 Specimen Collection (cultures, biopsies, blood, body fluids, etc.) []  - 0 Specimen(s) / Culture(s) sent or taken to Lab for analysis []  - 0 Patient Transfer (multiple staff / / Similar devices) []  - 0 Simple Staple / Suture removal (25 or less) []  - 0 Complex Staple / Suture removal (26 or more) []  - 0 Hypo / Hyperglycemic Management (close monitor of Blood Glucose) []  - 0 Ankle / Brachial Index (ABI) - do not check if billed separately X- 1 5 Vital Signs Has the patient been seen at the hospital within the last three years: Yes Total Score: 125 Level Of Care: New/Established - Level 4 Electronic Signature(s) Signed: 05/31/2020 5:39:22 PM By: Entered By: on 05/31/2020 16:44:46 -------------------------------------------------------------------------------- Encounter Discharge Information Details Patient Name: Date of Service: MA LA 05/31/2020 2:15 PM Medical Record Number: Patient Account Number: Date of Birth/Sex: Treating RN: 08-Sep-1925 (87 y.o. Primary Care Nora Sabey: Other Clinician: Referring  Dezi Brauner: Treating Eliga Arvie/Extender: in Treatment: 15 Encounter Discharge Information Items Discharge Condition: Stable Ambulatory Status: Wheelchair Discharge Destination: Home Transportation: Private Auto Accompanied By: Schedule Follow-up Appointment: Yes Clinical Summary of Care: Patient Declined Electronic Signature(s) Signed: 05/31/2020 5:40:31 PM By: Shawn Stall RN, BSN Entered By: Shawn Stall on 05/31/2020 16:47:06 -------------------------------------------------------------------------------- Lower Extremity Assessment Details Patient Name: Date of Service: MA Tiajuana Amass 05/31/2020 2:15 PM Medical Record Number: [id number] Patient Account Number: [id number] Date of Birth/Sex: Treating RN: [birth year] (87 y.o. [name] Primary Care Jaymi Tinner: Baltazar Najjar Other Clinician: Referring Elaynah Virginia: Treating Dalten Ambrosino/Extender: Dyanne Iha in Treatment: 15 Edema Assessment Assessed: Charolotte Capuchin: No] 06/02/2020: No] Edema: [Left: Ye] [Right: s] Calf Left: Right: Point of Measurement: 40 cm From Medial Instep 32.5 cm Ankle Left: Right: Point of Measurement: 10 cm From Medial Instep 24 cm Vascular Assessment Pulses: Dorsalis Pedis Palpable: [Left:Yes] Electronic Signature(s) Signed: 05/31/2020 5:28:50 PM By: Zenaida Deed RN, BSN Entered By: 06/02/2020 on 05/31/2020 14:21:16 -------------------------------------------------------------------------------- Multi Wound Chart Details Patient Name: Date of Service: MA 06/02/2020 [place] 05/31/2020 2:15 PM Medical Record Number: [id number] Patient Account Number: 02/[id number] Date of Birth/Sex: Treating RN: July 03, 1925 (87 y.o. Baltazar Najjar Primary Care Ileanna Gemmill: Dyanne Iha Other Clinician: Referring Britton Perkinson: Treating Wilver Tignor/Extender: [name] in Treatment: 15 Vital Signs Height(in): 66 Pulse(bpm):  74 Weight(lbs): 140 Blood Pressure(mmHg): 134/77 Body Mass Index(BMI): 23 Temperature(F): 98.6 Respiratory Rate(breaths/min): 16 Photos: [1:No Photos Left Calcaneus] [N/A:N/A N/A] Wound Location: [1:Gradually Appeared] [N/A:N/A] Wounding Event: [1:Diabetic Wound/Ulcer of the Lower] [N/A:N/A] Primary Etiology: [1:Extremity Hypertension, Type II Diabetes] [N/A:N/A] Comorbid History: [1:10/18/2019] [N/A:N/A] Date Acquired: [1:15] [N/A:N/A] Weeks of Treatment: [1:Open] [N/A:N/A] Wound Status: [1:1x1.8x0.2] [N/A:N/A] Measurements L x W x D (cm) [1:1.414] [N/A:N/A] A (cm) : rea [1:0.283] [N/A:N/A] Volume (cm) : [1:77.50%] [N/A:N/A] % Reduction in A  rea: [1:96.20%] [N/A:N/A] % Reduction in Volume: [1:Grade 2] [N/A:N/A] Classification: [1:Medium] [N/A:N/A] Exudate A mount: [1:Serosanguineous] [N/A:N/A] Exudate Type: [1:red, brown] [N/A:N/A] Exudate Color: [1:Distinct, outline attached] [N/A:N/A] Wound Margin: [1:Medium (34-66%)] [N/A:N/A] Granulation A mount: [1:Pink] [N/A:N/A] Granulation Quality: [1:Medium (34-66%)] [N/A:N/A] Necrotic A mount: [1:Fat Layer (Subcutaneous Tissue): Yes N/A] Exposed Structures: [1:Fascia: No Tendon: No Muscle: No Joint: No Bone: No Medium (34-66%)] [N/A:N/A] Treatment Notes Electronic Signature(s) Signed: 05/31/2020 5:01:02 PM By: Baltazar Najjarobson, Michael MD Signed: 05/31/2020 5:39:22 PM By: Shawn Stalleaton, Bobbi Entered By: Baltazar Najjarobson, Michael on 05/31/2020 15:31:33 -------------------------------------------------------------------------------- Multi-Disciplinary Care Plan Details Patient Name: Date of Service: MA Tiajuana AmassRTIN, CEO [place] 05/31/2020 2:15 PM Medical Record Number: [id number] Patient Account Number: [id number] Date of Birth/Sex: Treating RN: March 26, 1926 (87 y.o. Debara PickettF) Deaton, Yvonne KendallBobbi Primary Care Aundraya Dripps: Baltazar Najjarobson, Michael Other Clinician: Referring Aailyah Dunbar: Treating Maniya Donovan/Extender: Dyanne Ihaobson, Michael Robson, Michael Weeks in Treatment: 15 Active Inactive Abuse /  Safety / Falls / Self Care Management Nursing Diagnoses: Potential for falls Goals: Patient will remain injury free related to falls Date Initiated: 05/31/2020 Target Resolution Date: 07/13/2020 Goal Status: Active Interventions: Assess fall risk on admission and as needed Notes: Electronic Signature(s) Signed: 05/31/2020 5:39:22 PM By: Shawn Stalleaton, Bobbi Entered By: Shawn Stalleaton, Bobbi on 05/31/2020 14:30:26 -------------------------------------------------------------------------------- Pain Assessment Details Patient Name: Date of Service: Dennard SchaumannMA RTIN, CEO [place] 05/31/2020 2:15 PM Medical Record Number: [id number] Patient Account Number: [id number] Date of Birth/Sex: Treating RN: March 26, 1926 (87 y.o. Wynelle LinkF) Lynch, Shatara Primary Care Davis Ambrosini: Baltazar Najjarobson, Michael Other Clinician: Referring Lynn Recendiz: Treating Madysun Thall/Extender: Dyanne Ihaobson, Michael Robson, Michael Weeks in Treatment: 15 Active Problems Location of Pain Severity and Description of Pain Patient Has Paino No Site Locations Pain Management and Medication Current Pain Management: Electronic Signature(s) Signed: 05/31/2020 5:28:50 PM By: Zandra AbtsLynch, Shatara RN, BSN Entered By: Zandra AbtsLynch, Shatara on 05/31/2020 14:20:32 -------------------------------------------------------------------------------- Patient/Caregiver Education Details Patient Name: Date of Service: MA Rebecca BaloRTIN, CEO [place] 1/13/2022andnbsp2:15 PM Medical Record Number: [id number] Patient Account Number: [id number] Date of Birth/Gender: Treating RN: March 26, 1926 (87 y.o. Arta SilenceF) Deaton, Bobbi Primary Care Physician: Baltazar Najjarobson, Michael Other Clinician: Referring Physician: Treating Physician/Extender: Dyanne Ihaobson, Michael Robson, Michael Weeks in Treatment: 15 Education Assessment Education Provided To: Patient Education Topics Provided Wound/Skin Impairment: Handouts: Skin Care Do's and Dont's Methods: Explain/Verbal Responses: Reinforcements needed Electronic Signature(s) Signed: 05/31/2020 5:39:22  PM By: Shawn Stalleaton, Bobbi Entered By: Shawn Stalleaton, Bobbi on 05/31/2020 14:30:40 -------------------------------------------------------------------------------- Wound Assessment Details Patient Name: Date of Service: Dennard SchaumannMA RTIN, CEO [place] 05/31/2020 2:15 PM Medical Record Number: [id number] Patient Account Number: [id number] Date of Birth/Sex: Treating RN: March 26, 1926 (87 y.o. Wynelle LinkF) Lynch, Shatara Primary Care Tyrel Lex: Baltazar Najjarobson, Michael Other Clinician: Referring Calen Posch: Treating Johnaton Sonneborn/Extender: Dyanne Ihaobson, Michael Robson, Michael Weeks in Treatment: 15 Wound Status Wound Number: 1 Primary Etiology: Diabetic Wound/Ulcer of the Lower Extremity Wound Location: Left Calcaneus Wound Status: Open Wounding Event: Gradually Appeared Comorbid History: Hypertension, Type II Diabetes Date Acquired: 10/18/2019 Weeks Of Treatment: 15 Clustered Wound: No Wound Measurements Length: (cm) 1 Width: (cm) 1.8 Depth: (cm) 0.2 Area: (cm) 1.414 Volume: (cm) 0.283 % Reduction in Area: 77.5% % Reduction in Volume: 96.2% Epithelialization: Medium (34-66%) Tunneling: No Undermining: No Wound Description Classification: Grade 2 Wound Margin: Distinct, outline attached Exudate Amount: Medium Exudate Type: Serosanguineous Exudate Color: red, brown Foul Odor After Cleansing: No Slough/Fibrino Yes Wound Bed Granulation Amount: Medium (34-66%) Exposed Structure Granulation Quality: Pink Fascia Exposed: No Necrotic Amount: Medium (34-66%) Fat Layer (Subcutaneous Tissue) Exposed: Yes Necrotic Quality: Adherent Slough Tendon Exposed: No Muscle Exposed: No Joint Exposed: No Bone Exposed: No Treatment Notes Wound #1 (Calcaneus) Wound Laterality:  Left Cleanser Wound Cleanser Discharge Instruction: Cleanse the wound with wound cleanser prior to applying a clean dressing using gauze sponges, not tissue or cotton balls. Peri-Wound Care Sween Lotion (Moisturizing lotion) Discharge Instruction: Apply moisturizing  lotion as directed Topical Primary Dressing IODOFLEX 0.9% Cadexomer Iodine Pad 4x6 cm Discharge Instruction: Or Iodosorb. Apply to wound bed as instructed Secondary Dressing Woven Gauze Sponge, Non-Sterile 4x4 in Discharge Instruction: Apply over primary dressing as directed. ABD Pad, 5x9 Discharge Instruction: Apply over primary dressing as directed. Secured With [employer], 4.5x3.1 (in/yd) Discharge Instruction: Secure with Kerlix as directed. Paper Tape, 2x10 (in/yd) Discharge Instruction: Secure dressing with tape as directed. Compression Wrap Compression Stockings Add-Ons Electronic Signature(s) Signed: 05/31/2020 5:28:50 PM By: Zandra Abts RN, BSN Entered By: Zandra Abts on 05/31/2020 14:23:27 -------------------------------------------------------------------------------- Vitals Details Patient Name: Date of Service: [name] 05/31/2020 2:15 PM Medical Record Number: [id number] Patient Account Number: [id number] Date of Birth/Sex: Treating RN: May 22, 1925 (87 y.o. [name] Primary Care Avianah Pellman: Baltazar Najjar Other Clinician: Referring Tamieka Rancourt: Treating Ailsa Mireles/Extender: Dyanne Iha in Treatment: 15 Vital Signs Time Taken: 14:20 Temperature (F): 98.6 Height (in): 66 Pulse (bpm): 74 Weight (lbs): 140 Respiratory Rate (breaths/min): 16 Body Mass Index (BMI): 22.6 Blood Pressure (mmHg): 134/77 Reference Range: 80 - 120 mg / dl Electronic Signature(s) Signed: 05/31/2020 5:28:50 PM By: Zandra Abts RN, BSN Entered By: Zandra Abts on 05/31/2020 14:20:27

## 2020-06-13 ENCOUNTER — Other Ambulatory Visit: Payer: Medicare Other

## 2020-06-14 ENCOUNTER — Encounter (HOSPITAL_BASED_OUTPATIENT_CLINIC_OR_DEPARTMENT_OTHER): Payer: Medicare Other | Admitting: Internal Medicine

## 2020-06-14 ENCOUNTER — Other Ambulatory Visit: Payer: Self-pay

## 2020-06-14 DIAGNOSIS — E11621 Type 2 diabetes mellitus with foot ulcer: Secondary | ICD-10-CM | POA: Diagnosis not present

## 2020-06-14 NOTE — Progress Notes (Signed)
Rebecca Walsh, Rebecca Walsh ([id number]) Visit Report for 06/14/2020 Arrival Information Details Patient Name: Date of Service: Rebecca Walsh Rebecca Walsh 06/14/2020 2:30 PM Medical Record Number: [id number] Patient Account Number: [id number] Date of Birth/Sex: Treating RN: [birth year] (87 y.o. Rebecca Walsh, Rebecca Walsh Primary Care Victory Strollo: Baltazar Najjar Other Clinician: Referring Jame Morrell: Treating Khiya Friese/Extender: Dyanne Iha in Treatment: 17 Visit Information History Since Last Visit Added or deleted any medications: No Patient Arrived: Wheel Chair Any new allergies or adverse reactions: No Arrival Time: 14:52 Had a fall or experienced change in No Accompanied By: son activities of daily living that may affect Transfer Assistance: None risk of falls: Patient Identification Verified: Yes Signs or symptoms of abuse/neglect since last visito No Secondary Verification Process Completed: Yes Hospitalized since last visit: No Patient Requires Transmission-Based Precautions: No Implantable device outside of the clinic excluding No Patient Has Alerts: No cellular tissue based products placed in the center since last visit: Has Dressing in Place as Prescribed: Yes Pain Present Now: No Electronic Signature(s) Signed: 06/14/2020 3:17:01 PM By: Karl Ito Entered By: Karl Ito on 06/14/2020 14:52:55 -------------------------------------------------------------------------------- Lower Extremity Assessment Details Patient Name: Date of Service: [name] 06/14/2020 2:30 PM Medical Record Number: [id number] Patient Account Number: [id number] Date of Birth/Sex: Treating RN: Oct 29, 1925 (87 y.o. Rebecca Walsh Primary Care Kaveon Blatz: Baltazar Najjar Other Clinician: Referring Earmon Sherrow: Treating Montrel Donahoe/Extender: Dyanne Iha in Treatment: 17 Edema Assessment Assessed: Rebecca Walsh: No] Rebecca Walsh: No] Edema: [Left: Ye] [Right: s] Calf Left: Right: Point  of Measurement: 40 cm From Medial Instep 32.5 cm Ankle Left: Right: Point of Measurement: 10 cm From Medial Instep 24 cm Vascular Assessment Pulses: Dorsalis Pedis Palpable: [Left:Yes] Electronic Signature(s) Signed: 06/14/2020 5:48:06 PM By: Zandra Abts RN, BSN Entered By: Zandra Abts on 06/14/2020 14:59:13 -------------------------------------------------------------------------------- Multi Wound Chart Details Patient Name: Date of Service: [name] 06/14/2020 2:30 PM Medical Record Number: [id number] Patient Account Number: [id number] Date of Birth/Sex: Treating RN: [birth year] (87 y.o. Rebecca Walsh, Rebecca Walsh Primary Care Kwanza Cancelliere: Baltazar Najjar Other Clinician: Referring Montford Barg: Treating Alexyia Guarino/Extender: Dyanne Iha in Treatment: 17 Vital Signs Height(in): 66 Pulse(bpm): 105 Weight(lbs): 140 Blood Pressure(mmHg): 136/75 Body Mass Index(BMI): 23 Temperature(F): 97.7 Respiratory Rate(breaths/min): 16 Photos: [1:No Photos Left Calcaneus] [N/A:N/A N/A] Wound Location: [1:Rebecca Walsh] [N/A:N/A] Wounding Event: [1:Diabetic Wound/Ulcer of the Lower] [N/A:N/A] Primary Etiology: [1:Extremity Hypertension, Type II Diabetes] [N/A:N/A] Comorbid History: [1:10/18/2019] [N/A:N/A] Date Acquired: [1:17] [N/A:N/A] Weeks of Treatment: [1:Open] [N/A:N/A] Wound Status: [1:1x1.5x0.2] [N/A:N/A] Measurements L x W x D (cm) [1:1.178] [N/A:N/A] A (cm) : rea [1:0.236] [N/A:N/A] Volume (cm) : [1:81.30%] [N/A:N/A] % Reduction in A [1:rea: 96.90%] [N/A:N/A] % Reduction in Volume: [1:Grade 2] [N/A:N/A] Classification: [1:Medium] [N/A:N/A] Exudate A mount: [1:Serosanguineous] [N/A:N/A] Exudate Type: [1:red, brown] [N/A:N/A] Exudate Color: [1:Distinct, outline attached] [N/A:N/A] Wound Margin: [1:Medium (34-66%)] [N/A:N/A] Granulation A mount: [1:Pink] [N/A:N/A] Granulation Quality: [1:Medium (34-66%)] [N/A:N/A] Necrotic A mount: [1:Fat Layer  (Subcutaneous Tissue): Yes N/A] Exposed Structures: [1:Fascia: No Tendon: No Muscle: No Joint: No Bone: No Medium (34-66%)] [N/A:N/A] Epithelialization: [1:Debridement - Selective/Open Wound N/A] Debridement: Pre-procedure Verification/Time Out 15:25 [N/A:N/A] Taken: [1:Lidocaine 4% Topical Solution] [N/A:N/A] Pain Control: [1:Skin/Epidermis] [N/A:N/A] Level: [1:1.5] [N/A:N/A] Debridement A (sq cm): [1:rea Curette] [N/A:N/A] Instrument: [1:Minimum] [N/A:N/A] Bleeding: [1:Pressure] [N/A:N/A] Hemostasis A chieved: [1:0] [N/A:N/A] Procedural Pain: [1:0] [N/A:N/A] Post Procedural Pain: [1:Procedure was tolerated well] [N/A:N/A] Debridement Treatment Response: [1:1x1.5x0.2] [N/A:N/A] Post Debridement Measurements L x W x D (cm) [1:0.236] [N/A:N/A] Post Debridement Volume: (cm) [1:Debridement] [N/A:N/A] Treatment Notes Electronic  Signature(s) Signed: 06/14/2020 4:56:03 PM By: Baltazar Najjar MD Signed: 06/14/2020 5:42:01 PM By: Shawn Stall Entered By: Baltazar Najjar on 06/14/2020 15:33:02 -------------------------------------------------------------------------------- Multi-Disciplinary Care Plan Details Patient Name: Date of Service: [name] [place] 06/14/2020 2:30 PM Medical Record Number: [id number] Patient Account Number: [id number] Date of Birth/Sex: Treating RN: 03-21-1926 (87 y.o. Rebecca Walsh, Rebecca Walsh Primary Care Dyllan Hughett: Baltazar Najjar Other Clinician: Referring Sahara Fujimoto: Treating Jaheem Hedgepath/Extender: Dyanne Iha in Treatment: 17 Active Inactive Abuse / Safety / Falls / Self Care Management Nursing Diagnoses: Potential for falls Goals: Patient will remain injury free related to falls Date Initiated: 05/31/2020 Target Resolution Date: 07/20/2020 Goal Status: Active Interventions: Assess fall risk on admission and as needed Notes: Electronic Signature(s) Signed: 06/14/2020 5:42:01 PM By: Shawn Stall Entered By: Shawn Stall on 06/14/2020  15:19:39 -------------------------------------------------------------------------------- Pain Assessment Details Patient Name: Date of Service: Rebecca Walsh 06/14/2020 2:30 PM Medical Record Number: [id number] Patient Account Number: [id number] Date of Birth/Sex: Treating RN: 01-28-1926 (87 y.o. Rebecca Walsh, Rebecca Walsh Primary Care Othello Dickenson: Baltazar Najjar Other Clinician: Referring Jaleea Alesi: Treating Oluwadarasimi Redmon/Extender: Dyanne Iha in Treatment: 17 Active Problems Location of Pain Severity and Description of Pain Patient Has Paino No Site Locations Pain Management and Medication Current Pain Management: Electronic Signature(s) Signed: 06/14/2020 3:17:01 PM By: Karl Ito Signed: 06/14/2020 5:42:01 PM By: Shawn Stall Entered By: Karl Ito on 06/14/2020 14:53:24 -------------------------------------------------------------------------------- Patient/Caregiver Education Details Patient Name: Date of Service: [name] 1/27/2022andnbsp2:30 PM Medical Record Number: [id number] Patient Account Number: [id number] Date of Birth/Gender: Treating RN: 23-May-1925 (87 y.o. Rebecca Walsh Primary Care Physician: Baltazar Najjar Other Clinician: Referring Physician: Treating Physician/Extender: Dyanne Iha in Treatment: 17 Education Assessment Education Provided To: Patient Education Topics Provided Wound/Skin Impairment: Handouts: Skin Care Do's and Dont's Methods: Explain/Verbal Responses: Reinforcements needed Electronic Signature(s) Signed: 06/14/2020 5:42:01 PM By: Shawn Stall Entered By: Shawn Stall on 06/14/2020 15:19:53 -------------------------------------------------------------------------------- Wound Assessment Details Patient Name: Date of Service: Rebecca Walsh 06/14/2020 2:30 PM Medical Record Number: [id number] Patient Account Number: [id number] Date of Birth/Sex: Treating RN: 1926/05/02  (87 y.o. Rebecca Walsh, [name] Primary Care Alvester Eads: Baltazar Najjar Other Clinician: Referring Kolleen Ochsner: Treating Whitaker Holderman/Extender: Dyanne Iha in Treatment: 17 Wound Status Wound Number: 1 Primary Etiology: Diabetic Wound/Ulcer of the Lower Extremity Wound Location: Left Calcaneus Wound Status: Open Wounding Event: Rebecca Walsh Comorbid History: Hypertension, Type II Diabetes Date Acquired: 10/18/2019 Weeks Of Treatment: 17 Clustered Wound: No Wound Measurements Length: (cm) 1 Width: (cm) 1.5 Depth: (cm) 0.2 Area: (cm) 1.178 Volume: (cm) 0.236 % Reduction in Area: 81.3% % Reduction in Volume: 96.9% Epithelialization: Medium (34-66%) Tunneling: No Undermining: No Wound Description Classification: Grade 2 Wound Margin: Distinct, outline attached Exudate Amount: Medium Exudate Type: Serosanguineous Exudate Color: red, brown Foul Odor After Cleansing: No Slough/Fibrino Yes Wound Bed Granulation Amount: Medium (34-66%) Exposed Structure Granulation Quality: Pink Fascia Exposed: No Necrotic Amount: Medium (34-66%) Fat Layer (Subcutaneous Tissue) Exposed: Yes Necrotic Quality: Adherent Slough Tendon Exposed: No Muscle Exposed: No Joint Exposed: No Bone Exposed: No Electronic Signature(s) Signed: 06/14/2020 5:42:01 PM By: Shawn Stall Signed: 06/14/2020 5:48:06 PM By: Zandra Abts RN, BSN Entered By: Zandra Abts on 06/14/2020 14:59:27 -------------------------------------------------------------------------------- Vitals Details Patient Name: Date of Service: [name] 06/14/2020 2:30 PM Medical Record Number: [id number] Patient Account Number: [id number] Date of Birth/Sex: Treating RN: 1926/05/14 (87 y.o. Rebecca Walsh Primary Care Yarieliz Wasser: Baltazar Najjar Other Clinician: Referring Marwan Lipe: Treating Shaquna Geigle/Extender: Norval Morton,  Lamar Sprinkles in Treatment: 17 Vital Signs Time Taken: 14:52 Temperature  (F): 97.7 Height (in): 66 Pulse (bpm): 105 Weight (lbs): 140 Respiratory Rate (breaths/min): 16 Body Mass Index (BMI): 22.6 Blood Pressure (mmHg): 136/75 Reference Range: 80 - 120 mg / dl Electronic Signature(s) Signed: 06/14/2020 3:17:01 PM By: Karl Ito Entered By: Karl Ito on 06/14/2020 14:53:15

## 2020-06-14 NOTE — Progress Notes (Signed)
Rebecca Walsh, Rebecca Walsh ([id number]) Visit Report for 06/14/2020 Debridement Details Patient Name: Date of Service: [name] 06/14/2020 2:30 PM Medical Record Number: [id number] Patient Account Number: [id number] Date of Birth/Sex: Treating RN: [birth year] (87 y.o. Rebecca Walsh, [name] Primary Care Provider: Baltazar Najjar Other Clinician: Referring Provider: Treating Provider/Extender: Dyanne Iha in Treatment: 17 Debridement Performed for Assessment: Wound #1 Left Calcaneus Performed By: Physician Maxwell Caul., MD Debridement Type: Debridement Severity of Tissue Pre Debridement: Fat layer exposed Level of Consciousness (Pre-procedure): Awake and Alert Pre-procedure Verification/Time Out Yes - 15:25 Taken: Start Time: 15:26 Pain Control: Lidocaine 4% T opical Solution T Area Debrided (L x W): otal 1 (cm) x 1.5 (cm) = 1.5 (cm) Tissue and other material debrided: Viable, Non-Viable, Skin: Dermis , Skin: Epidermis, Fibrin/Exudate Level: Skin/Epidermis Debridement Description: Selective/Open Wound Instrument: Curette Bleeding: Minimum Hemostasis Achieved: Pressure End Time: 15:30 Procedural Pain: 0 Post Procedural Pain: 0 Response to Treatment: Procedure was tolerated well Level of Consciousness (Post- Awake and Alert procedure): Post Debridement Measurements of Total Wound Length: (cm) 1 Width: (cm) 1.5 Depth: (cm) 0.2 Volume: (cm) 0.236 Character of Wound/Ulcer Post Debridement: Improved Severity of Tissue Post Debridement: Fat layer exposed Post Procedure Diagnosis Same as Pre-procedure Electronic Signature(s) Signed: 06/14/2020 4:56:03 PM By: Baltazar Najjar MD Signed: 06/14/2020 5:42:01 PM By: Shawn Stall Entered By: Baltazar Najjar on 06/14/2020 15:33:19 -------------------------------------------------------------------------------- HPI Details Patient Name: Date of Service: Rebecca Walsh Rebecca Walsh 06/14/2020 2:30 PM Medical Record Number:  [id number] Patient Account Number: [id number] Date of Birth/Sex: Treating RN: 1926/05/09 (87 y.o. Rebecca Walsh Primary Care Provider: Baltazar Najjar Other Clinician: Referring Provider: Treating Provider/Extender: Dyanne Iha in Treatment: 17 History of Present Illness HPI Description: ADMISSION 02/10/2020 This is a 87 year old woman who lives in [facility] which is an assisted living in [place]. She has home health which is surprising since she has [place] healthcare Medicaid. Nevertheless she has had 15-month history of a substantial wound on the left heel. This is in the setting of type 2 diabetes. From what I can tell they have been using Santyl on the wound bed with [facility] changing the dressing 3 times a week. She has also developed more superficial areas on the posterior and lateral part of the heel. Apparently remotely the patient had multiple toes removed on both feet secondary to infection. She is up walking active. I could see a culture from [facility] on 9/16 that showed 2+ Proteus she apparently has received a course of Keflex. They are using a shoe for her with the heel cut out in the back which is appropriate for this reason I did not see the need to give her a heel offloading shoe. Past medical history; type 2 diabetes, parotid mass, carotid artery stenosis, pressure ulcer of the left heel, hypertension, chronic renal failure stage III We could not obtain a Doppler signal in her foot therefore we could not measure ABIs at the dorsalis pedis or posterior tibial on the left 10/7; patient we admitted the clinic 2 weeks ago. She lives then I believe an assisted living in [place]. The area on her left lateral heel is closed. The area on the medial heel still a substantive wound with some necrotic surface. We have been using Santyl but I am not exactly sure how often and were getting this changed. We changed to Iodoflex today  because of this. 11/8; this is a patient who has not been here in a  month and unfortunately I have lost track of her. The arterial studies that I ordered last visit were actually done but I do not think I have seen these. There were done on 02/28/20 at interventional radiology. Arterial studies; left resting ABI at 1.06 resting TBI was not obtained. There was a significant pressure decreased from the popliteal to the calf great toe pressure was not obtained. She had normal triphasic waveforms in the femoral artery, slightly dampened biphasic waveform in the popliteal artery and monophasic waveforms in the runoff vessels. The interpretation was that her ABIs could be falsely elevated in the setting of lower extremity arterial calcification. There is a significant pressure gradient drop off from the femoral to the runoff vessels as could be seen with bilateral superficial femoral artery stenosis. Consider CTA MRA or direct. The patient comes in today with her son. Apparently home health is coming once a week may be twice. We have been using Iodoflex to the deep punched out area on her left medial heel 04/11/2020 upon evaluation today patient's wound actually showed signs of improvement compared to last evaluation. She still has some slough noted in the base of the wound some callus around the edges I did perform a careful debridement today. 12/9; the patient's wound actually has come in in size still punched out and with some depth. We are using Iodoflex. She has a CT angiogram next week as directed by Dr. Loreta Ave 12/23; the patient arrives today with wet-to-dry dressings which have been ordered by Dr. Allena Katz who she saw this week also I believe. I thought she had a CT angiogram booked last week with Dr. Loreta Ave as I think it turns out to be next week and then a review to see if anything can be done from an endovascular point of view. 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 On 12/28 she underwent an angiogram with atherectomy and DCB of the high-grade popliteal stenosis this was done by Dr. Archer Asa. Apparently she tolerated this well. Her wound on the left medial heel has improved I was shown a dry scabbed area on the right plantar heel there is nothing open here. 1/27; patient with a wound on the left medial heel a pressure ulcer in the setting of PAD. She is underwent her revascularization. Follows up with radiology on 2/8. We have been using Iodoflex to the wounds. She is in assisted living hopefully they are paying attention to offloading Electronic Signature(s) Signed: 06/14/2020 4:56:03 PM By: Baltazar Najjar MD Entered By: Baltazar Najjar on 06/14/2020 15:35:04 -------------------------------------------------------------------------------- Physical Exam Details Patient Name: Date of Service: Rebecca Walsh Rebecca Walsh 06/14/2020 2:30 PM Medical Record Number: [id number] Patient Account Number: [id number] Date of Birth/Sex: Treating RN: [birth year] (87 y.o. Rebecca Walsh Primary Care Provider: Baltazar Najjar Other Clinician: Referring Provider: Treating Provider/Extender: Dyanne Iha in Treatment: 17 Notes Wound exam; left medial heel thick callused edges I removed with a #5 curette removing callus and nonviable skin. The wound otherwise looks reasonable. No surrounding infection there is no palpable bone Electronic Signature(s) Signed: 06/14/2020 4:56:03 PM By: Baltazar Najjar MD Entered By: Baltazar Najjar on 06/14/2020 15:35:50 -------------------------------------------------------------------------------- Physician Orders Details Patient Name: Date of Service: Rebecca Walsh Rebecca Walsh 06/14/2020 2:30 PM Medical Record Number: [id number] Patient Account Number: [id number] Date of Birth/Sex: Treating RN: 1926/03/26 (87 y.o. Rebecca Walsh Primary Care Provider: Baltazar Najjar Other  Clinician: Referring Provider: Treating Provider/Extender: Norval Morton,  Lamar SprinklesMichael Weeks in Treatment: 17 Verbal / Phone Orders: No Diagnosis Coding Follow-up Appointments Return Appointment in 2 weeks. Bathing/ Shower/ Hygiene May shower with protection but do not get wound dressing(s) wet. Edema Control - Lymphedema / SCD / Other Elevate legs to the level of the heart or above for 30 minutes daily and/or when sitting, a frequency of: Avoid standing for long periods of time. Moisturize legs daily. - lotion both legs. Off-Loading Other: - float heels with pillows to offload while in bed. Home Health No change in wound care orders this week; continue Home Health for wound care. May utilize formulary equivalent dressing for wound treatment orders unless otherwise specified. - [facility] to change three times a week. On the week patient to wound center twice a week. Wound Treatment Wound #1 - Calcaneus Wound Laterality: Left Cleanser: Wound Cleanser (Home Health) 2 x Per Week/30 Days Discharge Instructions: Cleanse the wound with wound cleanser prior to applying a clean dressing using gauze sponges, not tissue or cotton balls. Peri-Wound Care: Sween Lotion (Moisturizing lotion) (Home Health) 2 x Per Week/30 Days Discharge Instructions: Apply moisturizing lotion as directed Prim Dressing: IODOFLEX 0.9% Cadexomer Iodine Pad 4x6 cm (Home Health) 2 x Per Week/30 Days ary Discharge Instructions: Or Iodosorb. Apply to wound bed as instructed Secondary Dressing: Woven Gauze Sponge, Non-Sterile 4x4 in (Home Health) 2 x Per Week/30 Days Discharge Instructions: Apply over primary dressing as directed. Secondary Dressing: ABD Pad, 5x9 (Home Health) 2 x Per Week/30 Days Discharge Instructions: Apply over primary dressing as directed. Secured With: [employer] Roll Sterile, 4.5x3.1 (in/yd) (Home Health) 2 x Per Week/30 Days Discharge Instructions: Secure with Kerlix as  directed. Secured With: Paper Tape, 2x10 (in/yd) (Home Health) 2 x Per Week/30 Days Discharge Instructions: Secure dressing with tape as directed. Electronic Signature(s) Signed: 06/14/2020 4:56:03 PM By: Baltazar Najjarobson, Michael MD Signed: 06/14/2020 5:42:01 PM By: Shawn Stalleaton, Bobbi Entered By: Shawn Stalleaton, Bobbi on 06/14/2020 15:30:36 -------------------------------------------------------------------------------- Problem List Details Patient Name: Date of Service: Rebecca Walsh Rebecca BaloRTIN, CEO [place] 06/14/2020 2:30 PM Medical Record Number: [id number] Patient Account Number: [id number] Date of Birth/Sex: Treating RN: Aug 03, 1925 (87 y.o. 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 02/10/2020 No Yes E11.51 Type 2 diabetes mellitus with diabetic peripheral angiopathy without gangrene 02/10/2020 No Yes E11.40 Type 2 diabetes mellitus with diabetic neuropathy, unspecified 02/10/2020 No Yes Inactive Problems ICD-10 Code Description Active Date Inactive Date L97.123 Non-pressure chronic ulcer of left thigh with necrosis of muscle 02/10/2020 02/10/2020 L97.421 Non-pressure chronic ulcer of left heel and midfoot limited to breakdown of skin 02/10/2020 02/10/2020 Resolved Problems Electronic Signature(s) Signed: 06/14/2020 4:56:03 PM By: Baltazar Najjarobson, Michael MD Entered By: Baltazar Najjarobson, Michael on 06/14/2020 15:32:56 -------------------------------------------------------------------------------- Progress Note Details Patient Name: Date of Service: Rebecca Walsh Rebecca BaloRTIN, CEO [place] 06/14/2020 2:30 PM Medical Record Number: [id number] Patient Account Number: [id number] Date of Birth/Sex: Treating  RN: Aug 03, 1925 (87 y.o. Rebecca SilenceF) Deaton, Bobbi Primary Care Provider: Baltazar Najjarobson, Michael Other Clinician: Referring Provider: Treating Provider/Extender: Dyanne Ihaobson, Michael Robson, Michael Weeks in Treatment: 17 Subjective History of Present Illness (HPI) ADMISSION 02/10/2020 This is a 87 year old woman who lives in [facility] which is an assisted living in [place]. She has home health which is surprising since she has [place] healthcare  Medicaid. Nevertheless she has had 40-month history of a substantial wound on the left heel. This is in the setting of type 2 diabetes. From what I can tell they have been using Santyl on the wound bed [facility] changing the dressing 3 times a week. She has also developed more superficial areas on the posterior and lateral part of the heel. Apparently remotely the patient had multiple toes removed on both feet secondary to infection. She is up walking active. I could see a culture from [facility] on 9/16 that showed 2+ Proteus she apparently has received a course of Keflex. They are using a shoe for her with the heel cut out in the back which is appropriate for this reason I did not see the need to give her a heel offloading shoe. Past medical history; type 2 diabetes, parotid mass, carotid artery stenosis, pressure ulcer of the left heel, hypertension, chronic renal failure stage III We could not obtain a Doppler signal in her foot therefore we could not measure ABIs at the dorsalis pedis or posterior tibial on the left 10/7; patient we admitted the clinic 2 weeks ago. She lives then I believe an assisted living in [place]. The area on her left lateral heel is closed. The area on the medial heel still a substantive wound with some necrotic surface. We have been using Santyl but I am not exactly sure how often and were getting this changed. We changed to Iodoflex today because of this. 11/8; this is a patient who has not been here in a month  and unfortunately I have lost track of her. The arterial studies that I ordered last visit were actually done but I do not think I have seen these. There were done on 02/28/20 at interventional radiology. Arterial studies; left resting ABI at 1.06 resting TBI was not obtained. There was a significant pressure decreased from the popliteal to the calf great toe pressure was not obtained. She had normal triphasic waveforms in the femoral artery, slightly dampened biphasic waveform in the popliteal artery and monophasic waveforms in the runoff vessels. The interpretation was that her ABIs could be falsely elevated in the setting of lower extremity arterial calcification. There is a significant pressure gradient drop off from the femoral to the runoff vessels as could be seen with bilateral superficial femoral artery stenosis. Consider CTA MRA or direct. The patient comes in today with her son. Apparently home health is coming once a week may be twice. We have been using Iodoflex to the deep punched out area on her left medial heel 04/11/2020 upon evaluation today patient's wound actually showed signs of improvement compared to last evaluation. She still has some slough noted in the base of the wound some callus around the edges I did perform a careful debridement today. 12/9; the patient's wound actually has come in in size still punched out and with some depth. We are using Iodoflex. She has a CT angiogram next week as directed by Dr. Loreta Ave 12/23; the patient arrives today with wet-to-dry dressings which have been ordered by Dr. Allena Katz who she saw this week also I believe. I thought she had a CT angiogram booked last week with Dr. Loreta Ave as I think it turns out to be next week and then a review to see if anything can be done from an endovascular point of view. 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 On 12/28  she underwent an  angiogram with atherectomy and DCB of the high-grade popliteal stenosis this was done by Dr. Archer Asa. Apparently she tolerated this well. Her wound on the left medial heel has improved I was shown a dry scabbed area on the right plantar heel there is nothing open here. 1/27; patient with a wound on the left medial heel a pressure ulcer in the setting of PAD. She is underwent her revascularization. Follows up with radiology on 2/8. We have been using Iodoflex to the wounds. She is in assisted living hopefully they are paying attention to offloading Objective Constitutional Vitals Time Taken: 2:52 PM, Height: 66 in, Weight: 140 lbs, BMI: 22.6, Temperature: 97.7 F, Pulse: 105 bpm, Respiratory Rate: 16 breaths/min, Blood Pressure: 136/75 mmHg. Integumentary (Hair, Skin) Wound #1 status is Open. Original cause of wound was Gradually Appeared. The wound is located on the Left Calcaneus. The wound measures 1cm length x 1.5cm width x 0.2cm depth; 1.178cm^2 area and 0.236cm^3 volume. There is Fat Layer (Subcutaneous Tissue) exposed. There is no tunneling or undermining noted. There is a medium amount of serosanguineous drainage noted. The wound margin is distinct with the outline attached to the wound base. There is medium (34-66%) pink granulation within the wound bed. There is a medium (34-66%) amount of necrotic tissue within the wound bed including Adherent Slough. 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 There was a Selective/Open Wound Skin/Epidermis  Debridement with a total area of 1.5 sq cm performed by Maxwell Caul., MD. With the following instrument(s): Curette to remove Viable and Non-Viable tissue/material. Material removed includes Skin: Dermis, Skin: Epidermis, and Fibrin/Exudate after achieving pain control using Lidocaine 4% Topical Solution. A time out was conducted at 15:25, prior to the start of the procedure. A Minimum amount of bleeding was controlled with Pressure. The procedure was tolerated well with a pain level of 0 throughout and a pain level of 0 following the procedure. Post Debridement Measurements: 1cm length x 1.5cm width x 0.2cm depth; 0.236cm^3 volume. Character of Wound/Ulcer Post Debridement is improved. Severity of Tissue Post Debridement is: Fat layer exposed. Post procedure Diagnosis Wound #1: Same as Pre-Procedure Plan Follow-up Appointments: Return Appointment in 2 weeks. Bathing/ Shower/ Hygiene: May shower with protection but do not get wound dressing(s) wet. Edema Control - Lymphedema / SCD / Other: Elevate legs to the level of the heart or above for 30 minutes daily and/or when sitting, a frequency of: Avoid standing for long periods of time. Moisturize legs daily. - lotion both legs. Off-Loading: Other: - float heels with pillows to offload while in bed. Home Health: No change in wound care orders this week; continue Home Health for wound care. May utilize formulary equivalent dressing for wound treatment orders unless otherwise specified. - [facility] to change three times a week. On the week patient to wound center twice a week. WOUND #1: - Calcaneus Wound Laterality: Left Cleanser: Wound Cleanser (Home Health) 2 x Per Week/30 Days Discharge Instructions: Cleanse the wound with wound cleanser prior to applying a clean dressing using gauze sponges, not tissue or cotton balls. Peri-Wound Care: Donnal Moat (  Moisturizing lotion) (Home Health) 2 x Per Week/30 Days Discharge  Instructions: Apply moisturizing lotion as directed Prim Dressing: IODOFLEX 0.9% Cadexomer Iodine Pad 4x6 cm (Home Health) 2 x Per Week/30 Days ary Discharge Instructions: Or Iodosorb. Apply to wound bed as instructed Secondary Dressing: Woven Gauze Sponge, Non-Sterile 4x4 in (Home Health) 2 x Per Week/30 Days Discharge Instructions: Apply over primary dressing as directed. Secondary Dressing: ABD Pad, 5x9 (Home Health) 2 x Per Week/30 Days Discharge Instructions: Apply over primary dressing as directed. Secured With: [employer], 4.5x3.1 (in/yd) (Home Health) 2 x Per Week/30 Days Discharge Instructions: Secure with Kerlix as directed. Secured With: Paper T ape, 2x10 (in/yd) (Home Health) 2 x Per Week/30 Days Discharge Instructions: Secure dressing with tape as directed. #1 I have continued the Iodoflex for another 2 weeks. The wound is measuring smaller today surface is cleaner. 2. I written instructions to the assisted living for offloading at night Electronic Signature(s) Signed: 06/14/2020 4:56:03 PM By: Baltazar Najjar MD Entered By: Baltazar Najjar on 06/14/2020 15:36:54 -------------------------------------------------------------------------------- SuperBill Details Patient Name: Date of Service: Rebecca Walsh Rebecca Walsh 06/14/2020 Medical Record Number: [id number] Patient Account Number: [id number] Date of Birth/Sex: Treating RN: 11-12-1925 (87 y.o. 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

## 2020-06-26 ENCOUNTER — Ambulatory Visit
Admission: RE | Admit: 2020-06-26 | Discharge: 2020-06-26 | Disposition: A | Discharge status: Home / Self Care | Payer: Medicare Other | Source: Ambulatory Visit | Attending: Interventional Radiology | Admitting: Interventional Radiology

## 2020-06-26 DIAGNOSIS — S81802A Unspecified open wound, left lower leg, initial encounter: Secondary | ICD-10-CM

## 2020-06-26 HISTORY — PX: IR RADIOLOGIST EVAL & MGMT: IMG5224

## 2020-06-26 NOTE — Progress Notes (Signed)
Chief Complaint: Left foot wound  Referring Physician(s): Robson,Michael G  History of Present Illness: Rebecca Walsh is a 87 y.o. female presenting as a scheduled follow up to [facility] clinic, SP treatment of left lower extremity arterial disease contributing to non-healing wound.    Rebecca Walsh is here today with her son for the visit.   We treated her on 05/15/20.  My partner Dr. Archer Asa performed angiogram and atherectomy of high grade popliteal stenosis, improving the flow through the posterior tibial artery to the hind-foot.  At the time, we discovered that she, in fact, had a prior fem-pop bypass, which was widely patent.  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    She has done fine since the intervention, and recovered without problem.  She has continued to get regular wound care, and the wound is healing, but not yet closed.   She continues maximal medical therapy.      Past Surgical History:  Procedure Laterality Date  . IR ANGIOGRAM EXTREMITY LEFT  05/15/2020  . IR FEM POP ART ATHERECT INC PTA MOD SED  05/15/2020  . IR RADIOLOGIST EVAL & MGMT  04/05/2020  . IR US GUIDE VASC ACCESS LEFT  05/15/2020    Allergies: Patient has no known allergies.  Medications: Prior to Admission medications   Medication Sig Start Date End Date Taking? Authorizing Provider  acetaminophen (TYLENOL) 500 MG tablet Take 500 mg by mouth every 6 (six) hours as needed for moderate pain, fever or headache.    [provider]  alum & mag hydroxide-simeth (MAALOX/MYLANTA) 200-200-20 MG/5ML suspension Take 30 mLs by mouth every 6 (six) hours as needed for heartburn.    [provider]  amLODipine-benazepril (LOTREL) 10-20 MG capsule Take 1 capsule by mouth daily. 03/19/20   [provider]  clopidogrel (PLAVIX) 75 MG tablet Take 75 mg by mouth daily. 03/19/20   [provider]  donepezil (ARICEPT) 5 MG tablet Take 5 mg  by mouth at bedtime.    [provider]  glipiZIDE-metformin (METAGLIP) 5-500 MG tablet Take 1 tablet by mouth 2 (two) times daily.    [provider]  guaifenesin (ROBITUSSIN) 100 MG/5ML syrup Take 200 mg by mouth every 6 (six) hours as needed for cough.    [provider]  loperamide (IMODIUM A-D) 2 MG tablet Take 2 mg by mouth as needed for diarrhea or loose stools.    [provider]  LORazepam (ATIVAN) 0.5 MG tablet Take 0.25 mg by mouth at bedtime.    [provider]  magnesium hydroxide (MILK OF MAGNESIA) 400 MG/5ML suspension Take 30 mLs by mouth at bedtime as needed for mild constipation.    [provider]  memantine (NAMENDA) 5 MG tablet Take 5 mg by mouth daily.    [provider]  Neomycin-Bacitracin-Polymyxin (TRIPLE ANTIBIOTIC) [phone] OINT Apply 1 application topically daily as needed (wound care).    [provider]  risperiDONE (RISPERDAL) 0.5 MG tablet Take 0.75 mg by mouth at bedtime.    [provider]  simvastatin (ZOCOR) 20 MG tablet Take 20 mg by mouth every evening.    [provider]  traZODone (DESYREL) 50 MG tablet Take 50 mg by mouth at bedtime.    [provider]  vitamin B-12 (CYANOCOBALAMIN) 100 MCG tablet Take 100 mcg by mouth daily.    [provider]     No family history on file.  Social History  Socioeconomic History  . Marital status: Single    Spouse name: Not on file  . Number of children: Not on file  . Years of education: Not on file  . Highest education level: Not on file  Occupational History  . Not on file  Tobacco Use  . Smoking status: Not on file  . Smokeless tobacco: Not on file  Substance and Sexual Activity  . Alcohol use: Not on file  . Drug use: Not on file  . Sexual activity: Not on file  Other Topics Concern  . Not on file  Social History Narrative  . Not on file   Social Determinants of Health   Financial  Resource Strain: Not on file  Food Insecurity: Not on file  Transportation Needs: Not on file  Physical Activity: Not on file  Stress: Not on file  Social Connections: Not on file       Review of Systems: A 12 point ROS discussed and pertinent positives are indicated in the HPI above.  All other systems are negative.  Review of Systems  Vital Signs: There were no vitals taken for this visit.  Physical Exam Targeted exam confirms strong PT pulse on doppler.  + signal of the AT on doppler.   There is persisting wound of the medial left heel, perhaps 1.5cm diameter, with keratotic margin.  No erythema or sign of infection.  No drainage.  The site of puncture is healed with no palpable abnormality, redness/erythema.     Imaging: No results found.  Labs:  CBC: Recent Labs    05/15/20 1140  WBC 9.0  HGB 11.6*  HCT 38.2  PLT 396    COAGS: Recent Labs    05/15/20 1140  INR 1.0    BMP: Recent Labs    05/15/20 1140  NA 139  K 4.2  CL 104  CO2 24  GLUCOSE 148*  BUN 22  CALCIUM 9.2  CREATININE 1.27*  GFRNONAA 40*    LIVER FUNCTION TESTS: No results for input(s): BILITOT, AST, ALT, ALKPHOS, PROT, ALBUMIN in the last 8760 hours.  TUMOR MARKERS: No results for input(s): AFPTM, CEA, CA199, CHROMGRNA in the last 8760 hours.  Assessment and Plan:  Rebecca Walsh is 87 yo female, status post treatment of left popliteal stenosis contributing to poor healing of left heel wound.    She was treated with atherectomy and DEB 05/15/20, and has shows progression in healing.  She continues to see wound care, and has appointment this Thursday.    I discussed the results with her and her son, and that we would not recommend any further aggressive intervention as long as the healing progresses.  They understand, and seem satisfied with results so far.   Plan: - We will defer a follow up at this time, and are happy to see her back should there be any regression or change in the  status of the wound.  - Continue maximal medical therapy.      Electronically Signed: Gilmer Mor 06/26/2020, 4:38 PM   I spent a total of    25 Minutes in face to face in clinical consultation, greater than 50% of which was counseling/coordinating care for left heel wound, SP angiogram and intervention for popliteal stenosis.

## 2020-06-28 ENCOUNTER — Other Ambulatory Visit: Payer: Self-pay

## 2020-06-28 ENCOUNTER — Encounter (HOSPITAL_BASED_OUTPATIENT_CLINIC_OR_DEPARTMENT_OTHER): Payer: Medicare Other | Attending: Internal Medicine | Admitting: Internal Medicine

## 2020-06-28 DIAGNOSIS — L97423 Non-pressure chronic ulcer of left heel and midfoot with necrosis of muscle: Secondary | ICD-10-CM | POA: Insufficient documentation

## 2020-06-28 DIAGNOSIS — E1140 Type 2 diabetes mellitus with diabetic neuropathy, unspecified: Secondary | ICD-10-CM | POA: Diagnosis not present

## 2020-06-28 DIAGNOSIS — E1151 Type 2 diabetes mellitus with diabetic peripheral angiopathy without gangrene: Secondary | ICD-10-CM | POA: Diagnosis not present

## 2020-06-28 DIAGNOSIS — E11621 Type 2 diabetes mellitus with foot ulcer: Secondary | ICD-10-CM | POA: Diagnosis present

## 2020-06-29 NOTE — Progress Notes (Signed)
Rebecca Walsh, Rebecca Walsh ([id number]) Visit Report for 06/28/2020 HPI Details Patient Name: Date of Service: Rebecca Walsh 06/28/2020 2:00 PM Medical Record Number: [id number] Patient Account Number: [id number] Date of Birth/Sex: Treating RN: 1926/01/17 (87 y.o. Rebecca Walsh Primary Care Provider: Linton Walsh Other Clinician: Referring Provider: Treating Provider/Extender: Rebecca Walsh in Treatment: 7 History of Present Illness HPI Description: ADMISSION 02/10/2020 This is an 87 year old woman who lives in [place] which is an assisted living in [place]. She has home health which is surprising since she has [place] healthcare Medicaid. Nevertheless she has had 13-monthhistory of a substantial wound on the left heel. This is in the setting of type 2 diabetes. From what I can tell they have been using Santyl on the wound bed with [facility] changing the dressing 3 times a week. She has also developed more superficial areas on the posterior and lateral part of the heel. Apparently remotely the patient had multiple toes removed on both feet secondary to infection. She is up walking active. [facility] she apparently has received a course of Keflex. They are using a shoe for her with the heel cut out in the back which is appropriate for this reason I did not see the need to give her a heel offloading shoe. Past medical history; type 2 diabetes, parotid mass, carotid artery stenosis, pressure ulcer of the left heel, hypertension, chronic renal failure stage III We could not obtain a Doppler signal in her foot therefore we could not measure ABIs at the dorsalis pedis or posterior tibial on the left 10/7; patient we admitted the clinic 2 weeks ago. She lives then I believe an assisted living in [place] The area on her left lateral heel is closed. The area on the medial heel still a substantive  wound with some necrotic surface. We have been using Santyl but I am not exactly sure how often and were getting this changed. We changed to Iodoflex today because of this. 11/8; this is a patient who has not been here in a month and unfortunately I have lost track of her. The arterial studies that I ordered last visit were actually done but I do not think I have seen these. There were done on 02/28/20 at interventional radiology. Arterial studies; left resting ABI at 1.06 resting TBI was not obtained. There was a significant pressure decreased from the popliteal to the calf great toe pressure was not obtained. She had normal triphasic waveforms in the femoral artery, slightly dampened biphasic waveform in the popliteal artery and monophasic waveforms in the runoff vessels. The interpretation was that her ABIs could be falsely elevated in the setting of lower extremity arterial calcification. There is a significant pressure gradient drop off from the femoral to the runoff vessels as could be seen with bilateral superficial femoral artery stenosis. Consider CTA MRA or direct. The patient comes in today with her son. Apparently home health is coming once a week may be twice. We have been using Iodoflex to the deep punched out area on her left medial heel 04/11/2020 upon evaluation today patient's wound actually showed signs of improvement compared to last evaluation. She still has some slough noted in the base of the wound some callus around the edges I did perform a careful debridement today. 12/9; the patient's wound actually has come in in size still punched out and with some depth. We are  using Iodoflex. She has a CT angiogram next week as directed by Dr. Earleen Newport 12/23; the patient arrives today with wet-to-dry dressings which have been ordered by Dr. Posey Pronto who she saw this week also I believe. I thought she had a CT angiogram booked last week with Dr. Earleen Newport as I think it turns out to be next week  and then a review to see if anything can be done from an endovascular point of view. 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 On 12/28 she underwent an angiogram with atherectomy and DCB of the high-grade popliteal stenosis this was done by Dr. Laurence Ferrari. Apparently she tolerated this well. Her wound on the left medial heel has improved I was shown a dry scabbed area on the right plantar heel there is nothing open here. 1/27; patient with a wound on the left medial heel a pressure ulcer in the setting of PAD. She is underwent her revascularization. Follows up with radiology on 2/8. We have been using Iodoflex to the wounds. She is in assisted living hopefully they are paying attention to offloading 2/10; this is a patient with a wound on the left medial heel which was a pressure ulcer in the setting of PAD. She went to see interventional radiology. She had an atherectomy of a high-grade popliteal stenosis improving the flow through the posterior tibial artery to the hindfoot she had a prior femoropopliteal bypass which was patent. She also has AT occlusion. Her wound looks better. We have been using Iodoflex. In the course of physical exam we noticed that dark area over the lateral fifth metatarsal head. Not sure if this was trauma or more worrisome only a deep tissue injury. Electronic Signature(s) Signed: 06/28/2020 5:13:40 PM By: Rebecca Walsh Entered By: Rebecca Walsh on 06/28/2020 15:42:02 -------------------------------------------------------------------------------- Physical Exam Details Patient Name: Date of Service: Rebecca Walsh [name] 06/28/2020 2:00 PM Medical Record Number: [id number] Patient Account Number: [id number] Date of Birth/Sex: Treating RN: 11-19-1925 (87 y.o. Rebecca Walsh Primary Care Provider: Linton Walsh Other Clinician: Referring Provider: Treating Provider/Extender: Rebecca Walsh in Treatment: 19 Notes Exam; left medial heel some thick edges around the wound but in general the surface of the wound looks quite good and the volume was smaller I therefore did not debride this today. She has a area on the lateral fifth metatarsal head. There is nothing open here however the tissue is a dusky color. Electronic Signature(s) Signed: 06/28/2020 5:13:40 PM By: Rebecca Walsh Entered By: Rebecca Walsh on 06/28/2020 15:43:27 -------------------------------------------------------------------------------- Physician Orders Details Patient Name: Date of Service: Rebecca Walsh [place] 06/28/2020 2:00 PM Medical Record Number: [id number] Patient Account Number: [id number] Date of Birth/Sex: Treating RN: 12-16-1925 (87 y.o. 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 Bathing/ Shower/ Hygiene May shower with protection but do not get wound dressing(s) wet. Edema Control - Lymphedema / SCD / Other Elevate legs to the level of the heart or above for 30 minutes daily and/or when sitting, a frequency of:  Avoid standing for long periods of time. Moisturize legs daily. - lotion both legs. Off-Loading Other: - float heels with pillows to offload while in bed. Additional Orders / Instructions Other: - pad with foam to first met head for protection. Home Health No change in wound care orders this week;  continue Home Health for wound care. May utilize formulary equivalent dressing for wound treatment orders unless otherwise specified. - [facility] to change three times a week. On the week patient to wound center twice a week. Wound Treatment Wound #1 - Calcaneus Wound Laterality: Left Cleanser: Wound Cleanser (Home Health) 2 x Per Week/30 Days Discharge Instructions: Cleanse the wound with wound cleanser prior to applying a clean dressing using gauze sponges, not tissue or cotton balls. Peri-Wound Care: Sween Lotion (Moisturizing lotion) (Home Health) 2 x Per Week/30 Days Discharge Instructions: Apply moisturizing lotion as directed Prim Dressing: IODOFLEX 0.9% Cadexomer Iodine Pad 4x6 cm (Home Health) 2 x Per Week/30 Days ary Discharge Instructions: Or Iodosorb. Apply to wound bed as instructed Secondary Dressing: Woven Gauze Sponge, Non-Sterile 4x4 in (Home Health) 2 x Per Week/30 Days Discharge Instructions: Apply over primary dressing as directed. Secondary Dressing: ABD Pad, 5x9 (Home Health) 2 x Per Week/30 Days Discharge Instructions: Apply over primary dressing as directed. Secured With: [employer], 4.5x3.1 (in/yd) (Home Health) 2 x Per Week/30 Days Discharge Instructions: Secure with Kerlix as directed. Secured With: Paper Tape, 2x10 (in/yd) (Home Health) 2 x Per Week/30 Days Discharge Instructions: Secure dressing with tape as directed. Electronic Signature(s) Signed: 06/28/2020 5:13:40 PM By: Rebecca Walsh Signed: 06/28/2020 6:08:01 PM By: Deon Pilling Entered By: Deon Pilling on 06/28/2020 15:17:16 -------------------------------------------------------------------------------- Problem List Details Patient Name: Date of Service: Rebecca Walsh [place] 06/28/2020 2:00 PM Medical Record Number: [id number] Patient Account Number: [id number] Date of Birth/Sex: Treating RN: 1926/03/06 (87 y.o. 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 02/10/2020 No Yes E11.51 Type 2 diabetes mellitus with diabetic peripheral angiopathy without gangrene 02/10/2020 No Yes E11.40 Type 2 diabetes mellitus with diabetic neuropathy, unspecified 02/10/2020 No Yes Inactive Problems ICD-10 Code Description Active Date Inactive Date L97.123 Non-pressure chronic ulcer of left thigh with necrosis of muscle 02/10/2020 02/10/2020 L97.421 Non-pressure chronic ulcer of left heel and midfoot limited to breakdown of skin 02/10/2020 02/10/2020 Resolved Problems Electronic Signature(s) Signed: 06/28/2020 5:13:40 PM By: Rebecca Walsh Entered By: Rebecca Walsh on 06/28/2020 15:39:49 -------------------------------------------------------------------------------- Progress Note Details Patient Name: Date of Service: Rebecca Walsh [place] 06/28/2020 2:00 PM Medical Record Number: [id number] Patient Account Number: [id number] Date of Birth/Sex: Treating RN: [birth year] (87 y.o. Rebecca Walsh Primary Care Provider: Linton Walsh Other Clinician: Referring Provider: Treating Provider/Extender: Rebecca Walsh in Treatment: 19 Subjective History of Present Illness (HPI) ADMISSION 02/10/2020 This is a 87 year old woman who lives in [place] which is an assisted living in [place]. She has home health which is surprising since she has [place] healthcare Medicaid. Nevertheless she has had 87-monthhistory of a substantial wound on the left heel. This is in the setting of type 2 diabetes. From what I can tell they have been using Santyl on the wound bed [facility] changing the dressing  3 times a week. She has also developed more superficial areas on the posterior and lateral part of the heel. Apparently remotely the patient had multiple toes removed on both feet secondary to infection. She is up walking active. I could see a culture from [facility] on 9/16 that showed 2+ Proteus she apparently has received a course of Keflex. They are using a shoe for her with the heel cut out in the back which is appropriate for this reason I did not see the need to give her a heel offloading shoe. Past medical history; type 2 diabetes, parotid mass, carotid artery stenosis, pressure ulcer of the left heel, hypertension, chronic renal failure stage III We could not obtain a Doppler signal in her foot therefore we could not measure ABIs at the dorsalis pedis or posterior tibial on the left 10/7; patient we admitted the clinic 2 weeks ago. She lives then I believe an assisted living in [place]. The area on her left lateral heel is closed. The area on the medial heel still a substantive wound with some necrotic surface. We have been using Santyl but I am not exactly sure how often and were getting this changed. We changed to Iodoflex today because of this. 11/8; this is a patient who has not been here in a month and unfortunately I have lost track of her. The arterial studies that I ordered last visit were actually done but I do not think I have seen these. There were done on 02/28/20 at interventional radiology. Arterial studies; left resting ABI at 1.06 resting TBI was not obtained. There was a significant pressure decreased from the popliteal to the calf great toe pressure was not obtained. She had normal triphasic waveforms in the femoral artery, slightly dampened biphasic waveform in the popliteal artery and monophasic waveforms in the runoff vessels. The interpretation was that her ABIs could be falsely elevated in the setting of lower extremity arterial calcification. There is a  significant pressure gradient drop off from the femoral to the runoff vessels as could be seen with bilateral superficial femoral artery stenosis. Consider CTA MRA or direct. The patient comes in today with her son. Apparently home health is coming once a week may be twice. We have been using Iodoflex to the deep punched out area on her left medial heel 04/11/2020 upon evaluation today patient's wound actually showed signs of improvement compared to last evaluation. She still has some slough noted in the base of the wound some callus around the edges I did perform a careful debridement today. 12/9; the patient's wound actually has come in in size still punched out and with some depth. We are using Iodoflex. She has a CT angiogram next week as directed by Dr. Earleen Newport 12/23; the patient arrives today with wet-to-dry dressings which have been ordered by Dr. Posey Pronto who she saw this week also I believe. I thought she had a CT angiogram booked last week with Dr. Earleen Newport as I think it turns out to be next week and then a review to see if anything can be done from an endovascular point of view. 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 On 12/28 she underwent an angiogram with atherectomy and DCB of the high-grade popliteal stenosis this was done by Dr. Laurence Ferrari. Apparently she tolerated this well. Her wound on the left medial heel has improved I was shown a dry scabbed area on the right plantar heel  there is nothing open here. 1/27; patient with a wound on the left medial heel a pressure ulcer in the setting of PAD. She is underwent her revascularization. Follows up with radiology on 2/8. We have been using Iodoflex to the wounds. She is in assisted living hopefully they are paying attention to offloading 2/10; this is a patient with a wound on the left medial heel which was a pressure ulcer in the setting of PAD. She went to see  interventional radiology. She had an atherectomy of a high-grade popliteal stenosis improving the flow through the posterior tibial artery to the hindfoot she had a prior femoropopliteal bypass which was patent. She also has AT occlusion. Her wound looks better. We have been using Iodoflex. In the course of physical exam we noticed that dark area over the lateral fifth metatarsal head. Not sure if this was trauma or more worrisome only a deep tissue injury. Objective Constitutional Vitals Time Taken: 2:45 PM, Height: 66 in, Weight: 140 lbs, BMI: 22.6, Temperature: 98.3 F, Pulse: 80 bpm, Respiratory Rate: 16 breaths/min, Blood Pressure: 128/80 mmHg. Integumentary (Hair, Skin) Wound #1 status is Open. Original cause of wound was Gradually Appeared. The wound is located on the Left Calcaneus. The wound measures 0.9cm length x 0.6cm width x 0.2cm depth; 0.424cm^2 area and 0.085cm^3 volume. There is Fat Layer (Subcutaneous Tissue) exposed. There is no tunneling or undermining noted. There is a small amount of serosanguineous drainage noted. The wound margin is distinct with the outline attached to the wound base. There is large (67- 100%) red granulation within the wound bed. There is a small (1-33%) amount of necrotic tissue within the wound bed including Adherent Slough. 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 Bathing/ Shower/ Hygiene: May shower with protection but do not get wound dressing(s) wet. Edema Control - Lymphedema / SCD / Other: Elevate legs to the level of the heart or above for 30 minutes daily and/or when sitting, a frequency of: Avoid standing for long periods of  time. Moisturize legs daily. - lotion both legs. Off-Loading: Other: - float heels with pillows to offload while in bed. Additional Orders / Instructions: Other: - pad with foam to first met head for protection. Home Health: No change in wound care orders this week; continue Home Health for wound care. May utilize formulary equivalent dressing for wound treatment orders unless otherwise specified. - [facility] to change three times a week. On the week patient to wound center twice a week. WOUND #1: - Calcaneus Wound Laterality: Left Cleanser: Wound Cleanser (Home Health) 2 x Per Week/30 Days Discharge Instructions: Cleanse the wound with wound cleanser prior to applying a clean dressing using gauze sponges, not tissue or cotton balls. Peri-Wound Care: Sween Lotion (Moisturizing lotion) (Home Health) 2 x Per Week/30 Days Discharge Instructions: Apply moisturizing lotion as directed Prim Dressing: IODOFLEX 0.9% Cadexomer Iodine Pad 4x6 cm (Home Health) 2 x Per Week/30 Days ary Discharge Instructions: Or Iodosorb. Apply to wound bed as instructed Secondary Dressing: Woven Gauze Sponge, Non-Sterile 4x4 in (Home Health) 2 x Per Week/30 Days Discharge Instructions: Apply over primary dressing as directed. Secondary Dressing: ABD Pad, 5x9 (Home Health) 2 x Per Week/30 Days Discharge Instructions: Apply over primary dressing as directed. Secured  With: Kerlix Roll Sterile, 4.5x3.1 (in/yd) (Home Health) 2 x Per Week/30 Days Discharge Instructions: Secure with Kerlix as directed. Secured With: Paper T ape, 2x10 (in/yd) (Home Health) 2 x Per Week/30 Days Discharge Instructions: Secure dressing with tape as directed. 1. I continued the Iodoflex for another 2 weeks of the wound on the heel. After that may require debridement possibly silver collagen 2. We padded the area over the fifth metatarsal head. There is no opening here however the color suggest the possibility of a deep tissue  injury Electronic Signature(s) Signed: 06/28/2020 5:13:40 PM By: Rebecca Walsh Entered By: Rebecca Walsh on 06/28/2020 15:44:12 -------------------------------------------------------------------------------- SuperBill Details Patient Name: Date of Service: Rebecca Rebecca Walsh 06/28/2020 Medical Record Number: [id number] Patient Account Number: [id number] Date of Birth/Sex: Treating RN: 1925/12/11 (87 y.o. 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 E11.51 Type 2 diabetes mellitus with diabetic peripheral angiopathy without gangrene E11.40 Type 2 diabetes mellitus with diabetic neuropathy, unspecified Facility Procedures The patient participates with Medicare or their insurance follows the Medicare Facility Guidelines: CPT4 Code Description Modifier Quantity [id number] [place] VISIT-LEV 4 EST PT 1 Physician Procedures : CPT4 Code Description Modifier [id number] - WC PHYS LEVEL 3 - EST PT ICD-10 Diagnosis Description L97.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

## 2020-06-29 NOTE — Progress Notes (Signed)
Rebecca Walsh, HEIDER ([id number]) Visit Report for 06/28/2020 Arrival Information Details Patient Name: Date of Service: [name] 06/28/2020 2:00 PM Medical Record Number: [id number] Patient Account Number: [id number] Date of Birth/Sex: Treating RN: Sep 26, 1925 (87 y.o. [name], [name] Primary Care Chee Dimon: Baltazar Najjar Other Clinician: Referring Carmella Kees: Treating Amrit Cress/Extender: Dyanne Iha in Treatment: 19 Visit Information History Since Last Visit Added or deleted any medications: No Patient Arrived: Ambulatory Any new allergies or adverse reactions: No Arrival Time: 14:45 Had a fall or experienced change in No Accompanied By: daughter inlaw activities of daily living that may affect Transfer Assistance: None risk of falls: Patient Identification Verified: Yes Signs or symptoms of abuse/neglect since last visito No Secondary Verification Process Completed: Yes Hospitalized since last visit: No Patient Requires Transmission-Based Precautions: No Implantable device outside of the clinic excluding No Patient Has Alerts: No cellular tissue based products placed in the center since last visit: Has Dressing in Place as Prescribed: Yes Pain Present Now: No Electronic Signature(s) Signed: 06/29/2020 1:25:35 PM By: Karl Ito Entered By: Karl Ito on 06/28/2020 14:45:26 -------------------------------------------------------------------------------- Clinic Level of Care Assessment Details Patient Name: Date of Service: [place] [name] 06/28/2020 2:00 PM Medical Record Number: [id number] Patient Account Number: [id number] Date of Birth/Sex: Treating RN: [birth year] (87 y.o. [name], [name] Primary Care Ulla Mckiernan: Baltazar Najjar Other Clinician: Referring Harjot Dibello: Treating Nyshaun Standage/Extender: Dyanne Iha in Treatment: 19 Clinic Level of Care Assessment Items TOOL 4 Quantity Score X- 1 0 Use when only an EandM  is performed on FOLLOW-UP visit ASSESSMENTS - Nursing Assessment / Reassessment X- 1 10 Reassessment of Co-morbidities (includes updates in patient status) X- 1 5 Reassessment of Adherence to Treatment Plan ASSESSMENTS - Wound and Skin A ssessment / Reassessment X - Simple Wound Assessment / Reassessment - one wound 1 5 []  - 0 Complex Wound Assessment / Reassessment - multiple wounds X- 1 10 Dermatologic / Skin Assessment (not related to wound area) ASSESSMENTS - Focused Assessment X- 1 5 Circumferential Edema Measurements - multi extremities X- 1 10 Nutritional Assessment / Counseling / Intervention []  - 0 Lower Extremity Assessment (monofilament, tuning fork, pulses) []  - 0 Peripheral Arterial Disease Assessment (using hand held doppler) ASSESSMENTS - Ostomy and/or Continence Assessment and Care []  - 0 Incontinence Assessment and Management []  - 0 Ostomy Care Assessment and Management (repouching, etc.) PROCESS - Coordination of Care X - Simple Patient / Family Education for ongoing care 1 15 []  - 0 Complex (extensive) Patient / Family Education for ongoing care X- 1 10 Staff obtains Consents, Records, T Results / Process Orders est X- 1 10 Staff telephones HHA, Nursing Homes / Clarify orders / etc []  - 0 Routine Transfer to another Facility (non-emergent condition) []  - 0 Routine Hospital Admission (non-emergent condition) []  - 0 New Admissions / / Ordering NPWT Apligraf, etc. , []  - 0 Emergency Hospital Admission (emergent condition) X- 1 10 Simple Discharge Coordination []  - 0 Complex (extensive) Discharge Coordination PROCESS - Special Needs []  - 0 Pediatric / Minor Patient Management []  - 0 Isolation Patient Management []  - 0 Hearing / Language / Visual special needs []  - 0 Assessment of Community assistance (transportation, D/C planning, etc.) []  - 0 Additional assistance / Altered mentation []  - 0 Support Surface(s)  Assessment (bed, cushion, seat, etc.) INTERVENTIONS - Wound Cleansing / Measurement X - Simple Wound Cleansing - one wound 1 5 []  - 0 Complex Wound Cleansing - multiple wounds X- 1  5 Wound Imaging (photographs - any number of wounds) []  - 0 Wound Tracing (instead of photographs) X- 1 5 Simple Wound Measurement - one wound []  - 0 Complex Wound Measurement - multiple wounds INTERVENTIONS - Wound Dressings []  - 0 Small Wound Dressing one or multiple wounds X- 1 15 Medium Wound Dressing one or multiple wounds []  - 0 Large Wound Dressing one or multiple wounds []  - 0 Application of Medications - topical []  - 0 Application of Medications - injection INTERVENTIONS - Miscellaneous []  - 0 External ear exam []  - 0 Specimen Collection (cultures, biopsies, blood, body fluids, etc.) []  - 0 Specimen(s) / Culture(s) sent or taken to Lab for analysis []  - 0 Patient Transfer (multiple staff / / Similar devices) []  - 0 Simple Staple / Suture removal (25 or less) []  - 0 Complex Staple / Suture removal (26 or more) []  - 0 Hypo / Hyperglycemic Management (close monitor of Blood Glucose) []  - 0 Ankle / Brachial Index (ABI) - do not check if billed separately X- 1 5 Vital Signs Has the patient been seen at the hospital within the last three years: Yes Total Score: 125 Level Of Care: New/Established - Level 4 Electronic Signature(s) Signed: 06/28/2020 6:08:01 PM By: Entered By: on 06/28/2020 17:32:03 -------------------------------------------------------------------------------- Encounter Discharge Information Details Patient Name: Date of Service: [place] 06/28/2020 2:00 PM Medical Record Number: Patient Account Number: Date of Birth/Sex: Treating RN: [birth year] (87 y.o. Primary Care Calistro Rauf: Other Clinician: Referring Jahkeem Kurka: Treating Rayna Brenner/Extender: in Treatment: 19 Encounter Discharge Information Items Discharge Condition: Stable Ambulatory Status: Ambulatory Discharge Destination: Home Transportation: Private Auto Accompanied By: 08/26/2020 in law Schedule Follow-up Appointment: Yes Clinical Summary of Care: Patient Declined Electronic Signature(s) Signed: 06/28/2020 5:55:42 PM By: Shawn Stall RN, BSN Entered By: 08/26/2020 on 06/28/2020 16:07:18 -------------------------------------------------------------------------------- Lower Extremity Assessment Details Patient Name: Date of Service: MA 08/26/2020 06/28/2020 2:00 PM Medical Record Number: [id number] Patient Account Number: 02/14/1926 Date of Birth/Sex: Treating RN: March 14, 1926 (87 y.o. Baltazar Najjar Primary Care Talulah Schirmer: Dyanne Iha Other Clinician: Referring Liba Hulsey: Treating Barclay Lennox/Extender: 20 in Treatment: 19 Edema Assessment Assessed: Charolotte Capuchin: No] 08/26/2020: No] Edema: [Left: Ye] [Right: s] Calf Left: Right: Point of Measurement: 40 cm From Medial Instep 32 cm Ankle Left: Right: Point of Measurement: 10 cm From Medial Instep 22.8 cm Vascular Assessment Pulses: Dorsalis Pedis Palpable: [Left:Yes] Electronic Signature(s) Signed: 06/28/2020 5:55:42 PM By: Zenaida Deed RN, BSN Entered By: 08/26/2020 on 06/28/2020 15:02:05 -------------------------------------------------------------------------------- Multi-Disciplinary Care Plan Details Patient Name: Date of Service: MA 08/26/2020 [place] 06/28/2020 2:00 PM Medical Record Number: [id number] Patient Account Number: 02/14/1926 Date of Birth/Sex: Treating RN: Apr 01, 1926 (87 y.o. Baltazar Najjar, Dyanne Iha Primary Care Mana Haberl: [name] Other Clinician: Referring Camry Theiss: Treating Yessika Otte/Extender: [name] in Treatment: 19 Active Inactive Abuse / Safety / Falls / Self Care Management Nursing Diagnoses: Potential  for falls Goals: Patient will remain injury free related to falls Date Initiated: 05/31/2020 Target Resolution Date: 07/20/2020 Goal Status: Active Interventions: Assess fall risk on admission and as needed Notes: Electronic Signature(s) Signed: 06/28/2020 6:08:01 PM By: Tiajuana Amass Entered By: 08/26/2020 on 06/28/2020 14:18:25 -------------------------------------------------------------------------------- Pain Assessment Details Patient Name: Date of Service: [id number] 06/28/2020 2:00 PM Medical Record Number: [id number] Patient Account Number: [name] Date of Birth/Sex: Treating RN: [birth year] (87 y.o. F) [place], [place]  Primary Care Hannan Hutmacher: Baltazar Najjar Other Clinician: Referring Airyn Ellzey: Treating Gabrella Stroh/Extender: Dyanne Iha in Treatment: 19 Active Problems Location of Pain Severity and Description of Pain Patient Has Paino No Site Locations Rate the pain. Rate the pain. Current Pain Level: 0 Pain Management and Medication Current Pain Management: Electronic Signature(s) Signed: 06/28/2020 5:55:42 PM By: Zenaida Deed RN, BSN Signed: 06/28/2020 6:08:01 PM By: Shawn Stall Entered By: Zenaida Deed on 06/28/2020 15:00:53 -------------------------------------------------------------------------------- Patient/Caregiver Education Details Patient Name: Date of Service: [place] [name] 2/10/2022andnbsp2:00 PM Medical Record Number: [id number] Patient Account Number: [id number] Date of Birth/Gender: Treating RN: [birth year] (87 y.o. [name] Primary Care Physician: Baltazar Najjar Other Clinician: Referring Physician: Treating Physician/Extender: Dyanne Iha in Treatment: 19 Education Assessment Education Provided To: Patient and Caregiver Education Topics Provided Wound/Skin Impairment: Handouts: Caring for Your Ulcer Methods: Explain/Verbal Responses: Reinforcements  needed Electronic Signature(s) Signed: 06/28/2020 6:08:01 PM By: Shawn Stall Entered By: Shawn Stall on 06/28/2020 14:18:36 -------------------------------------------------------------------------------- Wound Assessment Details Patient Name: Date of Service: [name] 06/28/2020 2:00 PM Medical Record Number: [id number] Patient Account Number: [id number] Date of Birth/Sex: Treating RN: [birth year] (87 y.o. [name], [name] Primary Care Alexia Dinger: Baltazar Najjar Other Clinician: Referring Kiki Bivens: Treating Zakai Gonyea/Extender: Dyanne Iha in Treatment: 19 Wound Status Wound Number: 1 Primary Etiology: Diabetic Wound/Ulcer of the Lower Extremity Wound Location: Left Calcaneus Wound Status: Open Wounding Event: Gradually Appeared Comorbid History: Hypertension, Type II Diabetes Date Acquired: 10/18/2019 Weeks Of Treatment: 19 Clustered Wound: No Wound Measurements Length: (cm) 0.9 Width: (cm) 0.6 Depth: (cm) 0.2 Area: (cm) 0.424 Volume: (cm) 0.085 % Reduction in Area: 93.3% % Reduction in Volume: 98.9% Epithelialization: Small (1-33%) Tunneling: No Undermining: No Wound Description Classification: Grade 2 Wound Margin: Distinct, outline attached Exudate Amount: Small Exudate Type: Serosanguineous Exudate Color: red, brown Foul Odor After Cleansing: No Slough/Fibrino Yes Wound Bed Granulation Amount: Large (67-100%) Exposed Structure Granulation Quality: Red Fascia Exposed: No Necrotic Amount: Small (1-33%) Fat Layer (Subcutaneous Tissue) Exposed: Yes Necrotic Quality: Adherent Slough Tendon Exposed: No Muscle Exposed: No Joint Exposed: No Bone Exposed: No Treatment Notes Wound #1 (Calcaneus) Wound Laterality: Left Cleanser Wound Cleanser Discharge Instruction: Cleanse the wound with wound cleanser prior to applying a clean dressing using gauze sponges, not tissue or cotton balls. Peri-Wound Care Sween Lotion (Moisturizing  lotion) Discharge Instruction: Apply moisturizing lotion as directed Topical Primary Dressing IODOFLEX 0.9% Cadexomer Iodine Pad 4x6 cm Discharge Instruction: Or Iodosorb. Apply to wound bed as instructed Secondary Dressing Woven Gauze Sponge, Non-Sterile 4x4 in Discharge Instruction: Apply over primary dressing as directed. ABD Pad, 5x9 Discharge Instruction: Apply over primary dressing as directed. Secured With [employer], 4.5x3.1 (in/yd) Discharge Instruction: Secure with Kerlix as directed. Paper Tape, 2x10 (in/yd) Discharge Instruction: Secure dressing with tape as directed. Compression Wrap Compression Stockings Add-Ons Electronic Signature(s) Signed: 06/28/2020 5:55:42 PM By: Zenaida Deed RN, BSN Signed: 06/28/2020 6:08:01 PM By: Shawn Stall Entered By: Zenaida Deed on 06/28/2020 15:03:29 -------------------------------------------------------------------------------- Vitals Details Patient Name: Date of Service: [name] 06/28/2020 2:00 PM Medical Record Number: [id number] Patient Account Number: [id number] Date of Birth/Sex: Treating RN: 1926/05/18 (87 y.o. [name], [name] Primary Care Deon Duer: Baltazar Najjar Other Clinician: Referring Chanz Cahall: Treating Camauri Craton/Extender: Dyanne Iha in Treatment: 19 Vital Signs Time Taken: 14:45 Temperature (F): 98.3 Height (in): 66 Pulse (bpm): 80 Weight (lbs): 140 Respiratory Rate (breaths/min): 16 Body Mass Index (BMI): 22.6 Blood Pressure (mmHg): 128/80 Reference Range:  80 - 120 mg / dl Electronic Signature(s) Signed: 06/29/2020 1:25:35 PM By: Karl Ito Entered By: Karl Ito on 06/28/2020 14:45:38

## 2020-07-12 ENCOUNTER — Other Ambulatory Visit: Payer: Self-pay

## 2020-07-12 ENCOUNTER — Encounter (HOSPITAL_BASED_OUTPATIENT_CLINIC_OR_DEPARTMENT_OTHER): Payer: Medicare Other | Admitting: Internal Medicine

## 2020-07-12 DIAGNOSIS — E11621 Type 2 diabetes mellitus with foot ulcer: Secondary | ICD-10-CM | POA: Diagnosis not present

## 2020-07-12 NOTE — Progress Notes (Signed)
SAMYIA, MOTTER ([id number]) Visit Report for 07/12/2020 Debridement Details Patient Name: Date of Service: [place] [name] 07/12/2020 2:00 PM Medical Record Number: [id number] Patient Account Number: [id number] Date of Birth/Sex: Treating RN: 1926/01/20 (87 y.o. Helene Shoe, Meta.Reding Primary Care Provider: Linton Ham Other Clinician: Referring Provider: Treating Provider/Extender: Maia Petties in Treatment: 21 Debridement Performed for Assessment: Wound #1 Left Calcaneus Performed By: Physician Ricard Dillon., MD Debridement Type: Debridement Severity of Tissue Pre Debridement: Limited to breakdown of skin Level of Consciousness (Pre-procedure): Awake and Alert Pre-procedure Verification/Time Out Yes - 14:20 Taken: Start Time: 14:21 Pain Control: Lidocaine 4% T opical Solution T Area Debrided (L x W): otal 1 (cm) x 1 (cm) = 1 (cm) Tissue and other material debrided: Viable, Non-Viable, Callus, Skin: Dermis , Skin: Epidermis Level: Skin/Epidermis Debridement Description: Selective/Open Wound Instrument: Curette Bleeding: Minimum Hemostasis Achieved: Pressure End Time: 14:25 Procedural Pain: 0 Post Procedural Pain: 0 Response to Treatment: Procedure was tolerated well Level of Consciousness (Post- Awake and Alert procedure): Post Debridement Measurements of Total Wound Length: (cm) 0.4 Width: (cm) 0.3 Depth: (cm) 0.2 Volume: (cm) 0.019 Character of Wound/Ulcer Post Debridement: Improved Severity of Tissue Post Debridement: Limited to breakdown of skin Post Procedure Diagnosis Same as Pre-procedure Electronic Signature(s) Signed: 07/12/2020 5:09:37 PM By: Linton Ham MD Signed: 07/12/2020 5:39:27 PM By: Deon Pilling Entered By: Linton Ham on 07/12/2020 14:33:03 -------------------------------------------------------------------------------- HPI Details Patient Name: Date of Service: [name] [place] 07/12/2020 2:00 PM Medical Record  Number: [id number] Patient Account Number: [id number] Date of Birth/Sex: Treating RN: Apr 28, 1926 (87 y.o. [name] Primary Care Provider: Linton Ham Other Clinician: Referring Provider: Treating Provider/Extender: Maia Petties in Treatment: 21 History of Present Illness HPI Description: ADMISSION 02/10/2020 This is a 87 year old woman who lives in [facility] which is an assisted living in [place]. She has home health which is surprising since she has [place] healthcare Medicaid. Nevertheless she has had 33-monthhistory of a substantial wound on the left heel. This is in the setting of type 2 diabetes. From what I can tell they have been using Santyl on the wound bed [facility] changing the dressing 3 times a week. She has also developed more superficial areas on the posterior and lateral part of the heel. Apparently remotely the patient had multiple toes removed on both feet secondary to infection. She is up walking active. [facility] she apparently has received a course of Keflex. They are using a shoe for her with the heel cut out in the back which is appropriate for this reason I did not see the need to give her a heel offloading shoe. Past medical history; type 2 diabetes, parotid mass, carotid artery stenosis, pressure ulcer of the left heel, hypertension, chronic renal failure stage III We could not obtain a Doppler signal in her foot therefore we could not measure ABIs at the dorsalis pedis or posterior tibial on the left 10/7; patient we admitted the clinic 2 weeks ago. [place] living [place] The area on her left lateral heel is closed. The area on the medial heel still a substantive wound with some necrotic surface. We have been using Santyl but I am not exactly sure how often and were getting this changed. We changed to Iodoflex  today because of this. 11/8; this is a patient who has not  been here in a month and unfortunately I have lost track of her. The arterial studies that I ordered last visit were actually done but I do not think I have seen these. There were done on 02/28/20 at interventional radiology. Arterial studies; left resting ABI at 1.06 resting TBI was not obtained. There was a significant pressure decreased from the popliteal to the calf great toe pressure was not obtained. She had normal triphasic waveforms in the femoral artery, slightly dampened biphasic waveform in the popliteal artery and monophasic waveforms in the runoff vessels. The interpretation was that her ABIs could be falsely elevated in the setting of lower extremity arterial calcification. There is a significant pressure gradient drop off from the femoral to the runoff vessels as could be seen with bilateral superficial femoral artery stenosis. Consider CTA MRA or direct. The patient comes in today with her son. Apparently home health is coming once a week may be twice. We have been using Iodoflex to the deep punched out area on her left medial heel 04/11/2020 upon evaluation today patient's wound actually showed signs of improvement compared to last evaluation. She still has some slough noted in the base of the wound some callus around the edges I did perform a careful debridement today. 12/9; the patient's wound actually has come in in size still punched out and with some depth. We are using Iodoflex. She has a CT angiogram next week as directed by Dr. Earleen Newport 12/23; the patient arrives today with wet-to-dry dressings which have been ordered by Dr. Posey Pronto who she saw this week also I believe. I thought she had a CT angiogram booked last week with Dr. Earleen Newport as I think it turns out to be next week and then a review to see if anything can be done from an endovascular point of view. 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 On 12/28 she underwent an angiogram with atherectomy and DCB of the high-grade popliteal stenosis this was done by Dr. Laurence Ferrari. Apparently she tolerated this well. Her wound on the left medial heel has improved I was shown a dry scabbed area on the right plantar heel there is nothing open here. 1/27; patient with a wound on the left medial heel a pressure ulcer in the setting of PAD. She is underwent her revascularization. Follows up with radiology on 2/8. We have been using Iodoflex to the wounds. She is in assisted living hopefully they are paying attention to offloading 2/10; this is a patient with a wound on the left medial heel which was a pressure ulcer in the setting of PAD. She went to see interventional radiology. She had an atherectomy of a high-grade popliteal stenosis improving the flow through the posterior tibial artery to the hindfoot she had a prior femoropopliteal bypass which was patent. She also has AT occlusion. Her wound looks better. We have been using Iodoflex. In the course of physical exam we noticed that dark area over the lateral first metatarsal head. Not sure if this was trauma or more worrisome only a deep tissue injury. 2/24; the wound on the medial left heel is smaller. I am still concerned about the deep tissue injury over the first metatarsal head this is still not open but has a dark discoloration.. She has been revascularized. We are using Iodoflex on the heel Electronic Signature(s) Signed: 07/12/2020 5:09:37 PM By: Linton Ham MD Entered By: Linton Ham on 07/12/2020 14:34:18 -------------------------------------------------------------------------------- Physical  Exam Details Patient Name: Date of Service: [place] Cummins 07/12/2020 2:00 PM Medical Record Number: [id number] Patient Account Number: [id number] Date of Birth/Sex: Treating RN: 05-04-1926 (87 y.o. [name] Primary Care Provider:  Linton Ham Other Clinician: Referring Provider: Treating Provider/Extender: Maia Petties in Treatment: 21 Constitutional Patient is hypertensive.. Pulse regular and within target range for patient.Marland Kitchen Respirations regular, non-labored and within target range.. Temperature is normal and within the target range for the patient.Marland Kitchen Appears in no distress. Notes Wound exam; left medial heel. Debrided of surrounding soft skin and callus. The wound cleans up quite nicely there is still open area here but it superficial. Left first metatarsal head medially also looks like a deep tissue injury. I am not been sure how this would have happened. She did not come in her shoe but were going to give her a surgical shoe Electronic Signature(s) Signed: 07/12/2020 5:09:37 PM By: Linton Ham MD Entered By: Linton Ham on 07/12/2020 14:38:10 -------------------------------------------------------------------------------- Physician Orders Details Patient Name: Date of Service: [name] [place] 07/12/2020 2:00 PM Medical Record Number: [id number] Patient Account Number: [id number] Date of Birth/Sex: Treating RN: 04/22/1926 (87 y.o. [name] Primary Care Provider: Linton Ham Other Clinician: Referring Provider: Treating Provider/Extender: Maia Petties in Treatment: 21 Verbal / Phone Orders: No Diagnosis Coding Follow-up Appointments Return Appointment in 2 weeks. Bathing/ Shower/ Hygiene May shower with protection but do not get wound dressing(s) wet. Edema Control - Lymphedema / SCD / Other Elevate legs to the level of the heart or above for 30 minutes daily and/or when sitting, a frequency of: Avoid standing for long periods of time. Moisturize legs daily. - lotion both legs. Off-Loading Other: - float heels with pillows to offload while in bed. Additional Orders / Instructions Other: - pad with foam to first met head on left  foot for protection. Home Health No change in wound care orders this week; continue Home Health for wound care. May utilize formulary equivalent dressing for wound treatment orders unless otherwise specified. - [facility] to change three times a week. On the week patient to wound center twice a week. Wound Treatment Wound #1 - Calcaneus Wound Laterality: Left Cleanser: Wound Cleanser (Home Health) 2 x Per Week/30 Days Discharge Instructions: Cleanse the wound with wound cleanser prior to applying a clean dressing using gauze sponges, not tissue or cotton balls. Peri-Wound Care: Sween Lotion (Moisturizing lotion) (Home Health) 2 x Per Week/30 Days Discharge Instructions: Apply moisturizing lotion as directed Prim Dressing: IODOFLEX 0.9% Cadexomer Iodine Pad 4x6 cm (Home Health) 2 x Per Week/30 Days ary Discharge Instructions: Or Iodosorb. Apply to wound bed as instructed Secondary Dressing: Woven Gauze Sponge, Non-Sterile 4x4 in (Home Health) 2 x Per Week/30 Days Discharge Instructions: Apply over primary dressing as directed. Secondary Dressing: ABD Pad, 5x9 (Home Health) 2 x Per Week/30 Days Discharge Instructions: Apply over primary dressing as directed. Secured With: [employer], 4.5x3.1 (in/yd) (Home Health) 2 x Per Week/30 Days Discharge Instructions: Secure with Kerlix as directed. Secured With: Paper Tape, 2x10 (in/yd) (Home Health) 2 x Per Week/30 Days Discharge Instructions: Secure dressing with tape as directed. Electronic Signature(s) Signed: 07/12/2020 5:09:37 PM By: Linton Ham MD Signed: 07/12/2020 5:39:27 PM By: Deon Pilling Entered By: Deon Pilling on 07/12/2020 14:28:04 -------------------------------------------------------------------------------- Problem List Details Patient Name: Date of Service: [place] [name] 07/12/2020 2:00 PM Medical Record Number: [id number] Patient Account Number: [id number] Date of Birth/Sex: Treating RN: 1926/04/27  (  87 y.o. 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 02/10/2020 No Yes E11.51 Type 2 diabetes mellitus with diabetic peripheral angiopathy without gangrene 02/10/2020 No Yes E11.40 Type 2 diabetes mellitus with diabetic neuropathy, unspecified 02/10/2020 No Yes Inactive Problems ICD-10 Code Description Active Date Inactive Date L97.123 Non-pressure chronic ulcer of left thigh with necrosis of muscle 02/10/2020 02/10/2020 L97.421 Non-pressure chronic ulcer of left heel and midfoot limited to breakdown of skin 02/10/2020 02/10/2020 Resolved Problems Electronic Signature(s) Signed: 07/12/2020 5:09:37 PM By: Linton Ham MD Entered By: Linton Ham on 07/12/2020 14:32:40 -------------------------------------------------------------------------------- Progress Note Details Patient Name: Date of Service: [name] [place] 07/12/2020 2:00 PM Medical Record Number: [id number] Patient Account Number: [id number] Date of Birth/Sex: Treating RN: 04-21-1926 (87 y.o. [name] Primary Care Provider: Linton Ham Other Clinician: Referring Provider: Treating Provider/Extender: Maia Petties in Treatment: 21 Subjective History of Present Illness (HPI) ADMISSION 02/10/2020 This is a 87 year old woman who lives in [place] which is an assisted living in [place]. She has home health which is surprising since she has [place] healthcare Medicaid. Nevertheless she has had 33-monthhistory of a substantial wound on the left heel. This is in the setting of type 2 diabetes. From what I can tell they have been  using Santyl on the wound bed [facility] changing the dressing 3 times a week. She has also developed more superficial areas on the posterior and lateral part of the heel. Apparently remotely the patient had multiple toes removed on both feet secondary to infection. She is up walking active. [name] she apparently has received a course of Keflex. They are using a shoe for her with the heel cut out in the back which is appropriate for this reason I did not see the need to give her a heel offloading shoe. Past medical history; type 2 diabetes, parotid mass, carotid artery stenosis, pressure ulcer of the left heel, hypertension, chronic renal failure stage III We could not obtain a Doppler signal in her foot therefore we could not measure ABIs at the dorsalis pedis or posterior tibial on the left 10/7; patient we admitted the clinic 2 weeks ago. [place] The area on her left lateral heel is closed. The area on the medial heel still a substantive wound with some necrotic surface. We have been using Santyl but I am not exactly sure how often and were getting this changed. We changed to Iodoflex today because of this. 11/8; this is a patient who has not been here in a month and unfortunately I have lost track of her. The arterial studies that I ordered last visit were actually done but I do not think I have seen these. There were done on 02/28/20 at interventional radiology. Arterial studies; left resting ABI at 1.06 resting TBI was not obtained. There was a significant pressure decreased from the popliteal to the calf great toe pressure was not obtained. She had normal triphasic waveforms in the femoral artery, slightly dampened biphasic waveform in the popliteal artery and monophasic waveforms in the runoff vessels. The interpretation  was that her ABIs could be falsely  elevated in the setting of lower extremity arterial calcification. There is a significant pressure gradient drop off from the femoral to the runoff vessels as could be seen with bilateral superficial femoral artery stenosis. Consider CTA MRA or direct. The patient comes in today with her son. Apparently home health is coming once a week may be twice. We have been using Iodoflex to the deep punched out area on her left medial heel 04/11/2020 upon evaluation today patient's wound actually showed signs of improvement compared to last evaluation. She still has some slough noted in the base of the wound some callus around the edges I did perform a careful debridement today. 12/9; the patient's wound actually has come in in size still punched out and with some depth. We are using Iodoflex. She has a CT angiogram next week as directed by Dr. Earleen Newport 12/23; the patient arrives today with wet-to-dry dressings which have been ordered by Dr. Posey Pronto who she saw this week also I believe. I thought she had a CT angiogram booked last week with Dr. Earleen Newport as I think it turns out to be next week and then a review to see if anything can be done from an endovascular point of view. 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 On 12/28 she underwent an angiogram with atherectomy and DCB of the high-grade popliteal stenosis this was done by Dr. Laurence Ferrari. Apparently she tolerated this well. Her wound on the left medial heel has improved I was shown a dry scabbed area on the right plantar heel there is nothing open here. 1/27; patient with a wound on the left medial heel a pressure ulcer in the setting of PAD. She is underwent her revascularization. Follows up with radiology on 2/8. We have been using Iodoflex to the wounds. She is in assisted living hopefully they are paying attention to offloading 2/10; this is a patient with a wound on the left medial  heel which was a pressure ulcer in the setting of PAD. She went to see interventional radiology. She had an atherectomy of a high-grade popliteal stenosis improving the flow through the posterior tibial artery to the hindfoot she had a prior femoropopliteal bypass which was patent. She also has AT occlusion. Her wound looks better. We have been using Iodoflex. In the course of physical exam we noticed that dark area over the lateral first metatarsal head. Not sure if this was trauma or more worrisome only a deep tissue injury. 2/24; the wound on the medial left heel is smaller. I am still concerned about the deep tissue injury over the first metatarsal head this is still not open but has a dark discoloration.. She has been revascularized. We are using Iodoflex on the heel Objective Constitutional Patient is hypertensive.. Pulse regular and within target range for patient.Marland Kitchen Respirations regular, non-labored and within target range.. Temperature is normal and within the target range for the patient.Marland Kitchen Appears in no distress. Vitals Time Taken: 2:05 PM, Height: 66 in, Weight: 140 lbs, BMI: 22.6, Temperature: 98.5 F, Pulse: 92 bpm, Respiratory Rate: 16 breaths/min, Blood Pressure: 161/86 mmHg. General Notes: Wound exam; left medial heel. Debrided of surrounding soft skin and callus. The wound cleans up quite nicely there is still open area here but it superficial. ooLeft first metatarsal head medially also looks like a deep tissue injury. I am not been sure how this would have happened. She did  not come in her shoe but were going to give her a surgical shoe Integumentary (Hair, Skin) Wound #1 status is Open. Original cause of wound was Gradually Appeared. The date acquired was: 10/18/2019. The wound has been in treatment 21 weeks. The wound is located on the Left Calcaneus. The wound measures 0.4cm length x 0.3cm width x 0.2cm depth; 0.094cm^2 area and 0.019cm^3 volume. There is Fat Layer  (Subcutaneous Tissue) exposed. There is no tunneling or undermining noted. There is a small amount of serosanguineous drainage noted. The wound margin is distinct with the outline attached to the wound base. There is large (67-100%) red granulation within the wound bed. There is no necrotic tissue within the wound bed. 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 There was a Selective/Open Wound Skin/Epidermis Debridement with a total area of 1 sq cm performed by Ricard Dillon., MD. With the following instrument(s): Curette to remove Viable and Non-Viable tissue/material. Material removed includes Callus, Skin: Dermis, and Skin: Epidermis after achieving pain control using Lidocaine 4% Topical Solution. A time out was conducted at 14:20, prior to the start of the procedure. A Minimum amount of bleeding was controlled with Pressure. The procedure was tolerated well with a pain level of 0 throughout and a pain level of 0 following the procedure. Post Debridement Measurements: 0.4cm length x 0.3cm width x 0.2cm depth; 0.019cm^3 volume. Character of Wound/Ulcer Post Debridement is improved. Severity of Tissue Post Debridement is: Limited to breakdown of skin. Post procedure Diagnosis Wound #1: Same as Pre-Procedure Plan Follow-up Appointments: Return Appointment in 2 weeks. Bathing/ Shower/ Hygiene: May shower with protection but do not get wound dressing(s) wet. Edema Control - Lymphedema / SCD / Other: Elevate legs to the level of the heart or  above for 30 minutes daily and/or when sitting, a frequency of: Avoid standing for long periods of time. Moisturize legs daily. - lotion both legs. Off-Loading: Other: - float heels with pillows to offload while in bed. Additional Orders / Instructions: Other: - pad with foam to first met head on left foot for protection. Home Health: No change in wound care orders this week; continue Home Health for wound care. May utilize formulary equivalent dressing for wound treatment orders unless otherwise specified. - [facility] to change three times a week. On the week patient to wound center twice a week. WOUND #1: - Calcaneus Wound Laterality: Left Cleanser: Wound Cleanser (Home Health) 2 x Per Week/30 Days Discharge Instructions: Cleanse the wound with wound cleanser prior to applying a clean dressing using gauze sponges, not tissue or cotton balls. Peri-Wound Care: Sween Lotion (Moisturizing lotion) (Home Health) 2 x Per Week/30 Days Discharge Instructions: Apply moisturizing lotion as directed Prim Dressing: IODOFLEX 0.9% Cadexomer Iodine Pad 4x6 cm (Home Health) 2 x Per Week/30 Days ary Discharge Instructions: Or Iodosorb. Apply to wound bed as instructed Secondary Dressing: Woven Gauze Sponge, Non-Sterile 4x4 in (Home Health) 2 x Per Week/30 Days Discharge Instructions: Apply over primary dressing as directed. Secondary Dressing: ABD Pad, 5x9 (Home Health) 2 x Per Week/30 Days Discharge Instructions: Apply over primary dressing as directed.  Secured With: [employer], 4.5x3.1 (in/yd) (Home Health) 2 x Per Week/30 Days Discharge Instructions: Secure with Kerlix as directed. Secured With: Paper T ape, 2x10 (in/yd) (Home Health) 2 x Per Week/30 Days Discharge Instructions: Secure dressing with tape as directed. 1. Continue with the Iodoflex to the heel 2. Continue with foam padding to the first met head 3. The patient has been revascularized she should be able to heal this  area on the heel Electronic Signature(s) Signed: 07/12/2020 5:09:37 PM By: Linton Ham MD Entered By: Linton Ham on 07/12/2020 14:42:03 -------------------------------------------------------------------------------- SuperBill Details Patient Name: Date of Service: [place] [name] 07/12/2020 Medical Record Number: [id number] Patient Account Number: [id number] Date of Birth/Sex: Treating RN: January 14, 1926 (87 y.o. 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 E11.51 Type 2 diabetes mellitus with diabetic peripheral angiopathy without gangrene E11.40 Type 2 diabetes mellitus with diabetic neuropathy, unspecified Facility Procedures The patient participates with Medicare or their insurance follows the Medicare Facility Guidelines: CPT4 Code Description Modifier Quantity [id number] - DEBRIDE WOUND 1ST 20 SQ CM OR < 1 ICD-10 Diagnosis Description L97.423 Non-pressure chronic ulcer  of left heel and midfoot with necrosis of muscle Physician Procedures : CPT4 Code Description Modifier [id number] - WC PHYS LEVEL 3 - EST PT ICD-10 Diagnosis Description 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

## 2020-07-16 NOTE — Progress Notes (Signed)
[name], [name] ([id number]) Visit Report for 07/12/2020 Arrival Information Details Patient Name: Date of Service: [name] 07/12/2020 2:00 PM Medical Record Number: [id number] Patient Account Number: [id number] Date of Birth/Sex: Treating RN: 01-14-1926 (87 y.o. Rebecca Walsh Primary Care Provider: Baltazar Najjar Other Clinician: Referring Provider: Treating Provider/Extender: Dyanne Iha in Treatment: 21 Visit Information History Since Last Visit Added or deleted any medications: No Patient Arrived: Wheel Chair Any new allergies or adverse reactions: No Arrival Time: 14:04 Had a fall or experienced change in Yes Accompanied By: son activities of daily living that may affect Transfer Assistance: Manual risk of falls: Patient Identification Verified: Yes Signs or symptoms of abuse/neglect since last visito No Secondary Verification Process Completed: Yes Hospitalized since last visit: No Patient Requires Transmission-Based Precautions: No Implantable device outside of the clinic excluding No Patient Has Alerts: No cellular tissue based products placed in the center since last visit: Has Dressing in Place as Prescribed: Yes Pain Present Now: No Electronic Signature(s) Signed: 07/16/2020 5:56:44 PM By: Zandra Abts RN, BSN Entered By: Zandra Abts on 07/12/2020 14:04:47 -------------------------------------------------------------------------------- Lower Extremity Assessment Details Patient Name: Date of Service: Rebecca Walsh 07/12/2020 2:00 PM Medical Record Number: [id number] Patient Account Number: [id number] Date of Birth/Sex: Treating RN: 12/12/1925 (87 y.o. Rebecca Walsh Primary Care Provider: Baltazar Najjar Other Clinician: Referring Provider: Treating Provider/Extender: Dyanne Iha in Treatment: 21 Edema Assessment Assessed: Rebecca Walsh: No] Rebecca Walsh: No] Edema: [Left: Ye] [Right: s] Calf Left:  Right: Point of Measurement: 40 cm From Medial Instep 33 cm Ankle Left: Right: Point of Measurement: 10 cm From Medial Instep 22.4 cm Vascular Assessment Pulses: Dorsalis Pedis Palpable: [Left:Yes] Electronic Signature(s) Signed: 07/16/2020 5:56:44 PM By: Zandra Abts RN, BSN Entered By: Zandra Abts on 07/12/2020 14:09:44 -------------------------------------------------------------------------------- Multi Wound Chart Details Patient Name: Date of Service: Rebecca Walsh 07/12/2020 2:00 PM Medical Record Number: [id number] Patient Account Number: [id number] Date of Birth/Sex: Treating RN: 1925/08/16 (87 y.o. Rebecca Walsh Primary Care Provider: Baltazar Najjar Other Clinician: Referring Provider: Treating Provider/Extender: Dyanne Iha in Treatment: 21 Vital Signs Height(in): 66 Pulse(bpm): 92 Weight(lbs): 140 Blood Pressure(mmHg): 161/86 Body Mass Index(BMI): 23 Temperature(F): 98.5 Respiratory Rate(breaths/min): 16 Photos: [1:No Photos Left Calcaneus] [N/A:N/A N/A] Wound Location: [1:Gradually Appeared] [N/A:N/A] Wounding Event: [1:Diabetic Wound/Ulcer of the Lower] [N/A:N/A] Primary Etiology: [1:Extremity Hypertension, Type II Diabetes] [N/A:N/A] Comorbid History: [1:10/18/2019] [N/A:N/A] Date Acquired: [1:21] [N/A:N/A] Weeks of Treatment: [1:Open] [N/A:N/A] Wound Status: [1:0.4x0.3x0.2] [N/A:N/A] Measurements L x W x D (cm) [1:0.094] [N/A:N/A] A (cm) : rea [1:0.019] [N/A:N/A] Volume (cm) : [1:98.50%] [N/A:N/A] % Reduction in A [1:rea: 99.70%] [N/A:N/A] % Reduction in Volume: [1:Grade 2] [N/A:N/A] Classification: [1:Small] [N/A:N/A] Exudate A mount: [1:Serosanguineous] [N/A:N/A] Exudate Type: [1:red, brown] [N/A:N/A] Exudate Color: [1:Distinct, outline attached] [N/A:N/A] Wound Margin: [1:Large (67-100%)] [N/A:N/A] Granulation A mount: [1:Red] [N/A:N/A] Granulation Quality: [1:None Present (0%)] [N/A:N/A] Necrotic A  mount: [1:Fat Layer (Subcutaneous Tissue): Yes N/A] Exposed Structures: [1:Fascia: No Tendon: No Muscle: No Joint: No Bone: No Large (67-100%)] [N/A:N/A] Epithelialization: [1:Debridement - Selective/Open Wound N/A] Debridement: Pre-procedure Verification/Time Out 14:20 [N/A:N/A] Taken: [1:Lidocaine 4% Topical Solution] [N/A:N/A] Pain Control: [1:Callus] [N/A:N/A] Tissue Debrided: [1:Skin/Epidermis] [N/A:N/A] Level: [1:1] [N/A:N/A] Debridement A (sq cm): [1:rea Curette] [N/A:N/A] Instrument: [1:Minimum] [N/A:N/A] Bleeding: [1:Pressure] [N/A:N/A] Hemostasis A chieved: [1:0] [N/A:N/A] Procedural Pain: [1:0] [N/A:N/A] Post Procedural Pain: [1:Procedure was tolerated well] [N/A:N/A] Debridement Treatment Response: [1:0.4x0.3x0.2] [N/A:N/A] Post Debridement Measurements L x W x D (cm) [1:0.019] [N/A:N/A] Post Debridement  Volume: (cm) [1:Debridement] [N/A:N/A] Procedures Performed: Treatment Notes Electronic Signature(s) Signed: 07/12/2020 5:09:37 PM By: Baltazar Najjar MD Signed: 07/12/2020 5:39:27 PM By: Shawn Stall Entered By: Baltazar Najjar on 07/12/2020 14:32:47 -------------------------------------------------------------------------------- Multi-Disciplinary Care Plan Details Patient Name: Date of Service: Rebecca Walsh 07/12/2020 2:00 PM Medical Record Number: [id number] Patient Account Number: [id number] Date of Birth/Sex: Treating RN: 1925/12/15 (87 y.o. Rebecca Walsh, Rebecca Walsh Primary Care Provider: Baltazar Najjar Other Clinician: Referring Provider: Treating Provider/Extender: Dyanne Iha in Treatment: 21 Multidisciplinary Care Plan reviewed with physician Active Inactive Abuse / Safety / Falls / Self Care Management Nursing Diagnoses: Potential for falls Goals: Patient will remain injury free related to falls Date Initiated: 05/31/2020 Target Resolution Date: 07/20/2020 Goal Status: Active Interventions: Assess fall risk on admission and  as needed Notes: Electronic Signature(s) Signed: 07/12/2020 5:39:27 PM By: Shawn Stall Entered By: Shawn Stall on 07/12/2020 14:23:47 -------------------------------------------------------------------------------- Pain Assessment Details Patient Name: Date of Service: Rebecca Walsh 07/12/2020 2:00 PM Medical Record Number: [id number] Patient Account Number: [id number] Date of Birth/Sex: Treating RN: [birth year] (87 y.o. Rebecca Walsh Primary Care Provider: Baltazar Najjar Other Clinician: Referring Provider: Treating Provider/Extender: Dyanne Iha in Treatment: 21 Active Problems Location of Pain Severity and Description of Pain Patient Has Paino No Site Locations Pain Management and Medication Current Pain Management: Electronic Signature(s) Signed: 07/16/2020 5:56:44 PM By: Zandra Abts RN, BSN Entered By: Zandra Abts on 07/12/2020 14:06:05 -------------------------------------------------------------------------------- Patient/Caregiver Education Details Patient Name: Date of Service: Rebecca Walsh 2/24/2022andnbsp2:00 PM Medical Record Number: [id number] Patient Account Number: [id number] Date of Birth/Gender: Treating RN: January 03, 1926 (87 y.o. Rebecca Walsh Primary Care Physician: Baltazar Najjar Other Clinician: Referring Physician: Treating Physician/Extender: Dyanne Iha in Treatment: 21 Education Assessment Education Provided To: Patient Education Topics Provided Wound/Skin Impairment: Handouts: Skin Care Do's and Dont's Methods: Explain/Verbal Responses: Reinforcements needed Electronic Signature(s) Signed: 07/12/2020 5:39:27 PM By: Shawn Stall Entered By: Shawn Stall on 07/12/2020 14:24:50 -------------------------------------------------------------------------------- Wound Assessment Details Patient Name: Date of Service: Rebecca Walsh 07/12/2020 2:00 PM Medical Record Number:  [id number] Patient Account Number: [id number] Date of Birth/Sex: Treating RN: May 08, 1926 (87 y.o. Rebecca Walsh Primary Care Provider: Baltazar Najjar Other Clinician: Referring Provider: Treating Provider/Extender: Dyanne Iha in Treatment: 21 Wound Status Wound Number: 1 Primary Etiology: Diabetic Wound/Ulcer of the Lower Extremity Wound Location: Left Calcaneus Wound Status: Open Wounding Event: Gradually Appeared Comorbid History: Hypertension, Type II Diabetes Date Acquired: 10/18/2019 Weeks Of Treatment: 21 Clustered Wound: No Photos Wound Measurements Length: (cm) 0.4 Width: (cm) 0.3 Depth: (cm) 0.2 Area: (cm) 0.094 Volume: (cm) 0.019 % Reduction in Area: 98.5% % Reduction in Volume: 99.7% Epithelialization: Large (67-100%) Tunneling: No Undermining: No Wound Description Classification: Grade 2 Wound Margin: Distinct, outline attached Exudate Amount: Small Exudate Type: Serosanguineous Exudate Color: red, brown Foul Odor After Cleansing: No Slough/Fibrino No Wound Bed Granulation Amount: Large (67-100%) Exposed Structure Granulation Quality: Red Fascia Exposed: No Necrotic Amount: None Present (0%) Fat Layer (Subcutaneous Tissue) Exposed: Yes Tendon Exposed: No Muscle Exposed: No Joint Exposed: No Bone Exposed: No Electronic Signature(s) Signed: 07/13/2020 4:34:48 PM By: Karl Ito Signed: 07/16/2020 5:56:44 PM By: Zandra Abts RN, BSN Entered By: Karl Ito on 07/13/2020 15:59:46 -------------------------------------------------------------------------------- Vitals Details Patient Name: Date of Service: Rebecca Walsh 07/12/2020 2:00 PM Medical Record Number: [id number] Patient Account Number: [id number] Date of Birth/Sex: Treating RN: 27-Dec-1925 (87 y.o. Rebecca Walsh Primary Care Provider: Baltazar Najjar Other  Clinician: Referring Provider: Treating Provider/Extender: Dyanne Iha in Treatment: 21 Vital Signs Time Taken: 14:05 Temperature (F): 98.5 Height (in): 66 Pulse (bpm): 92 Weight (lbs): 140 Respiratory Rate (breaths/min): 16 Body Mass Index (BMI): 22.6 Blood Pressure (mmHg): 161/86 Reference Range: 80 - 120 mg / dl Electronic Signature(s) Signed: 07/16/2020 5:56:44 PM By: Zandra Abts RN, BSN Entered By: Zandra Abts on 07/12/2020 14:06:00

## 2020-07-26 ENCOUNTER — Encounter (HOSPITAL_BASED_OUTPATIENT_CLINIC_OR_DEPARTMENT_OTHER): Payer: Medicare Other | Admitting: Physician Assistant

## 2020-08-17 DEATH — deceased

## 2022-09-11 IMAGING — US IR ANGIO/EXT/UNI*L*
1 series · 4 of 4 positions shown · non-contrast
Comparison: none

INDICATION: [AGE] female with dementia and critical limb ischemia as
evidence by a poorly healing wound along the medial aspect of her
left heel. She presents for left lower extremity arteriogram and
potential intervention.

[Series 1: ir angio/ext/uni*left* · 4 of 4 slices shown]
[im 1/4]
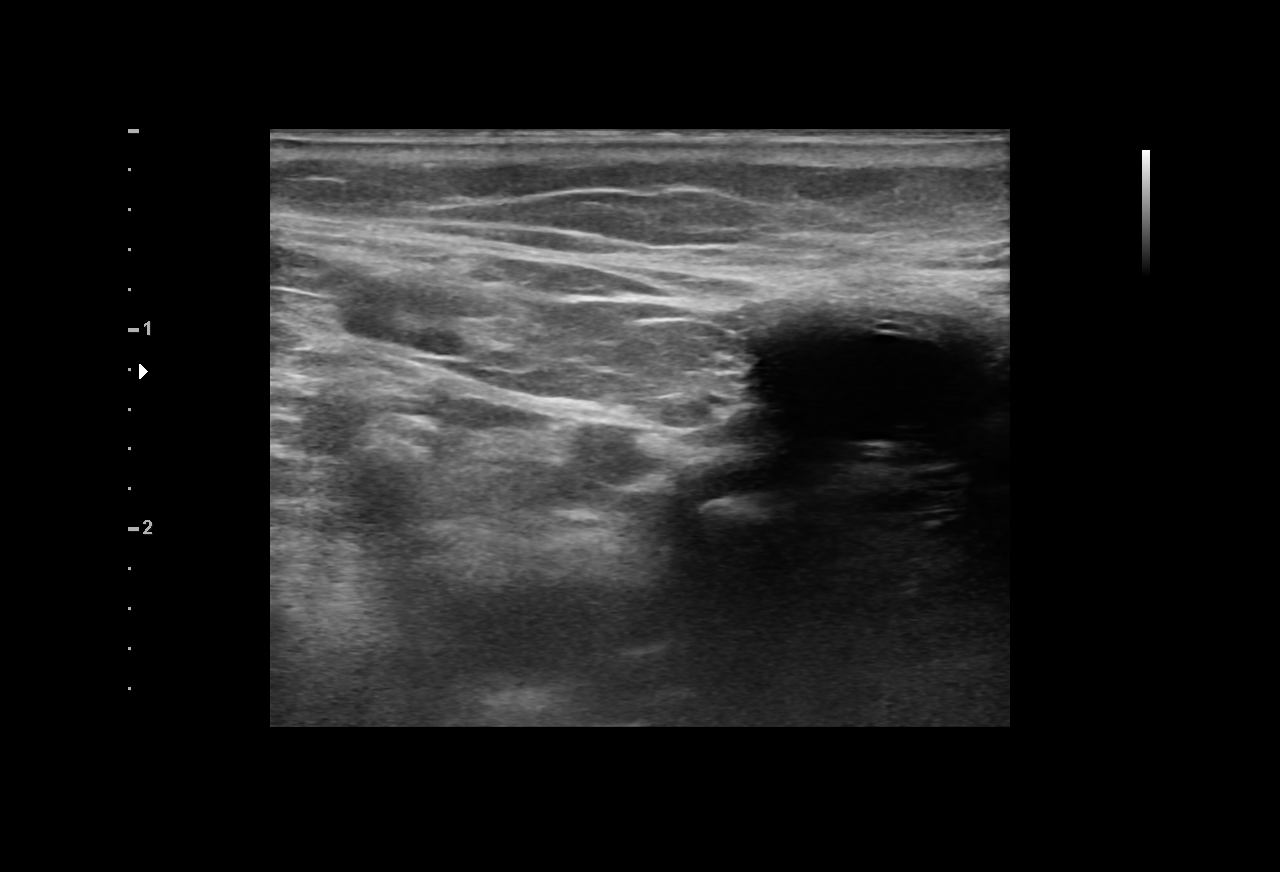
[im 2/4]
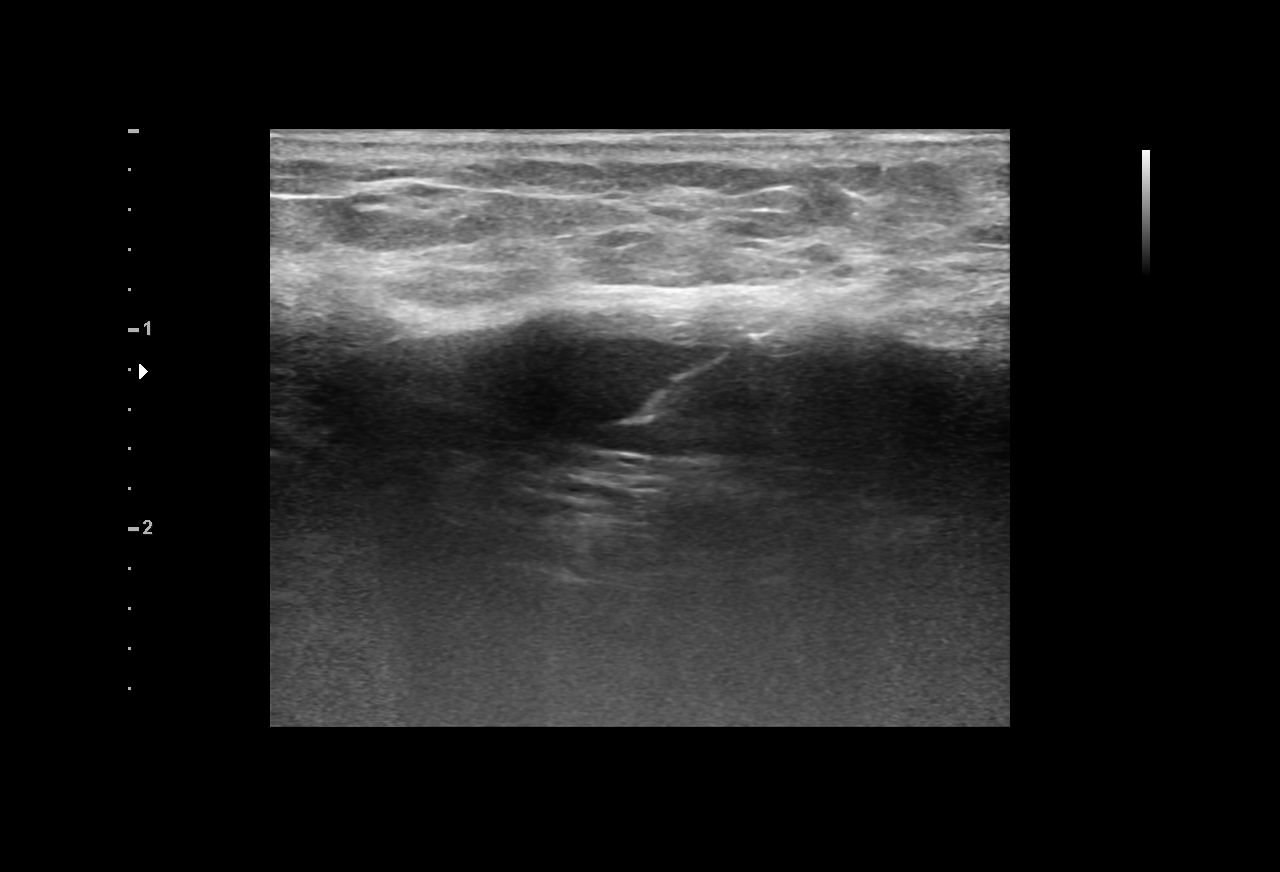
[im 3/4]
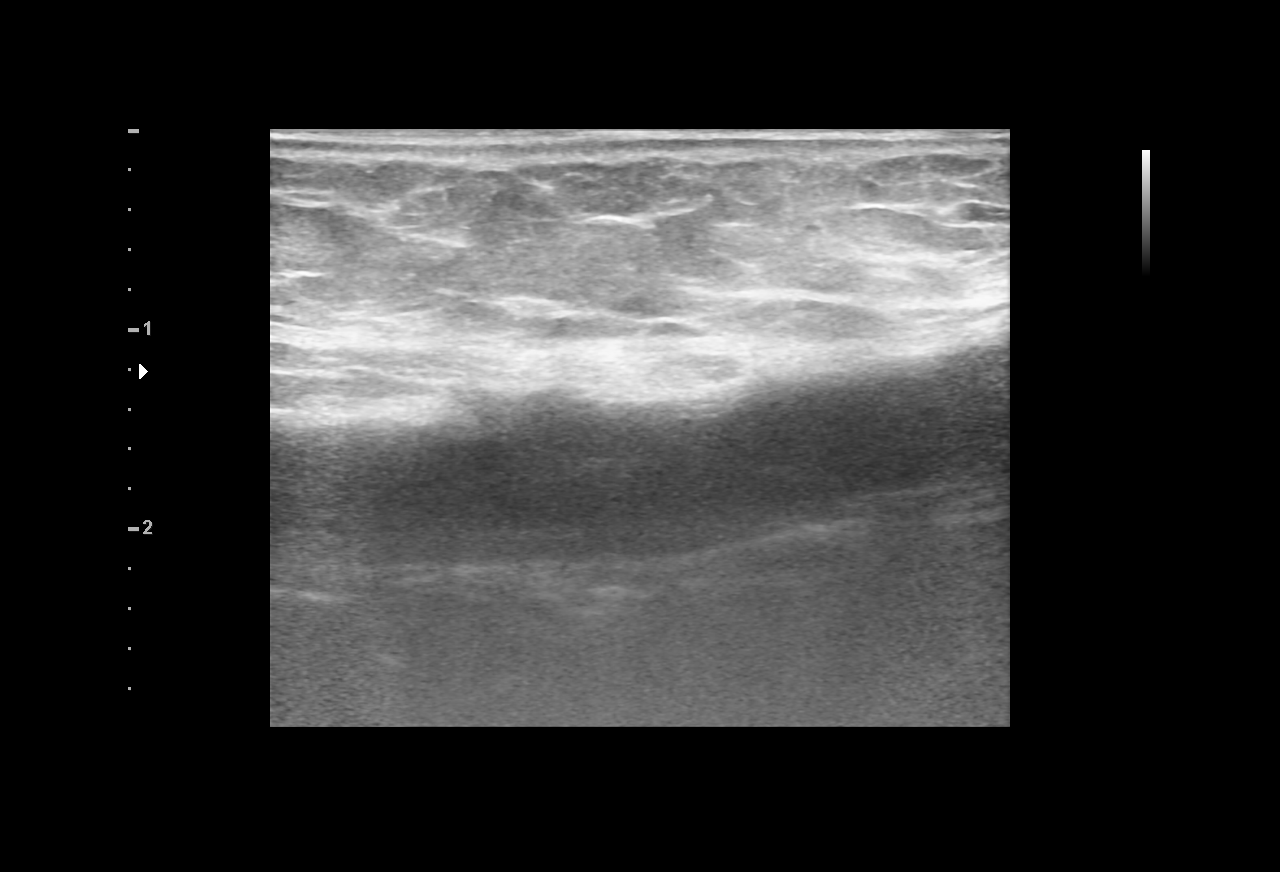
[im 4/4]
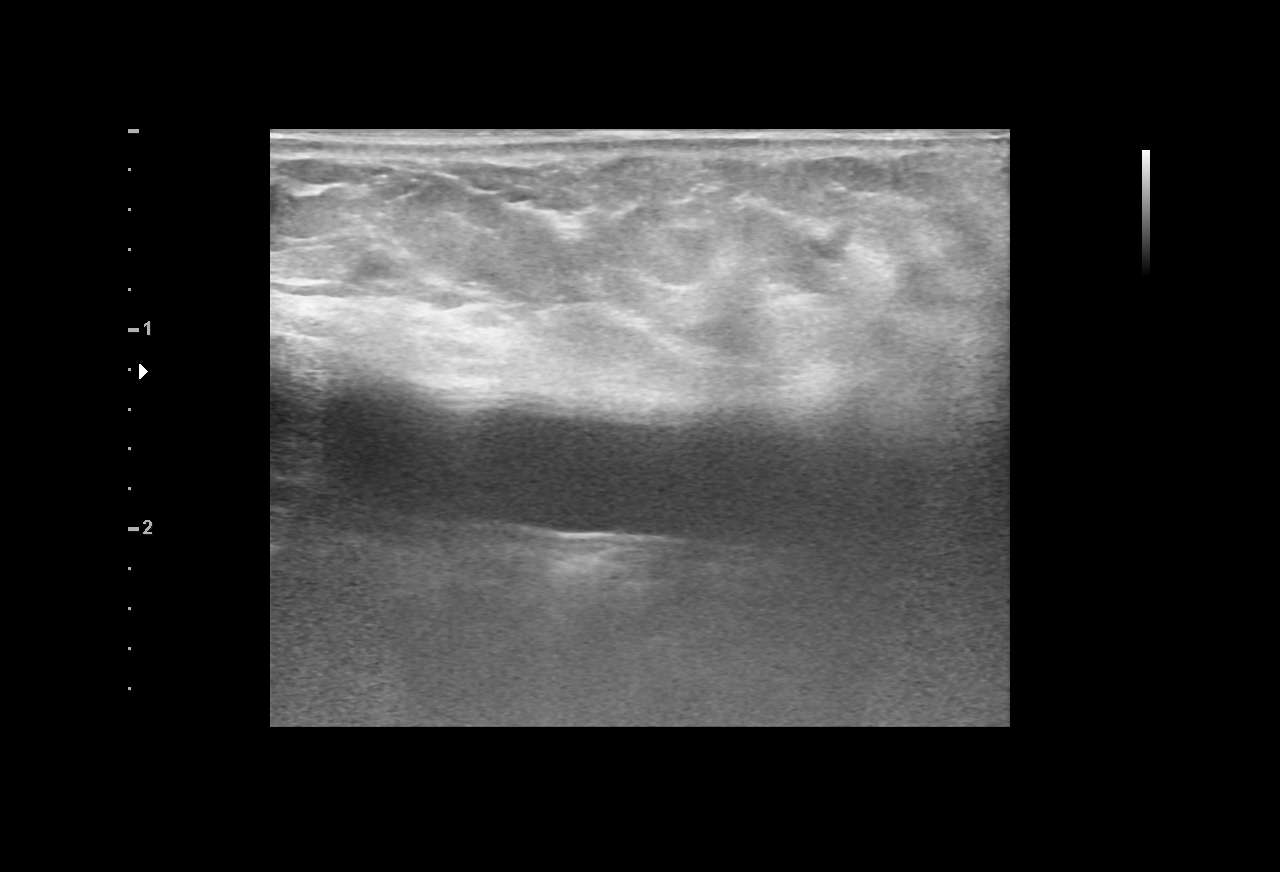

[4 of 4 positions shown; findings below may reference images not displayed]

EXAM:
1. Left lower extremity arteriogram
2. Atherectomy and drug coated balloon angioplasty of high-grade
popliteal stenosis

MEDICATIONS:
9666 units heparin, 600 mcg nitroglycerin

ANESTHESIA/SEDATION:
Moderate (conscious) sedation was employed during this procedure. A
total of 5 mg Valium and Fentanyl 25 mcg was administered
intravenously.

Moderate Sedation Time: 69 minutes. The patient's level of
consciousness and vital signs were monitored continuously by
radiology nursing throughout the procedure under my direct
supervision.

CONTRAST:  70 mL Omnipaque 300

FLUOROSCOPY TIME:  Fluoroscopy Time: 6 minutes 54 seconds (25 mGy).

COMPLICATIONS:
None immediate.



The left inguinal femoropopliteal bypass graft was interrogated with
ultrasound and found to be widely patent. An image was obtained and
stored for the medical record. Local anesthesia was attained by
infiltration with 1% lidocaine. A small dermatotomy was made. Under
real-time sonographic guidance, the graft was punctured in antegrade
fashion with a 21 gauge micropuncture needle. Using standard
technique, the initial micro needle was exchanged over a 0.018 micro
wire for a transitional 4 French micro sheath. The micro sheath was
then exchanged over a 0.035 wire for a 5 French vascular sheath.

A left lower extremity arteriogram was then performed. There is a
high-grade (greater than 70%) stenosis of the P2 segment of the
popliteal artery. The remainder of the popliteal artery is widely
patent. The posterior tibial and peroneal arteries are widely
patent. The anterior tibial artery is diffusely diseased and
occludes above the ankle. There is no significant reconstitution at
the dorsalis pedis. Both the medial and lateral plantar arteries are
widely patent.

The initial 5 French sheath was exchanged for a 55 cm 6 French
sheath over a stiff wire. The sheath was position with the tip just
proximal to the femoropopliteal bypass graft distal anastomosis. A
0.014 viper wire was carefully advanced across the stenosis and into
the peroneal artery. Orbital atherectomy was then performed using
the 1.5 mm solid crown CSI diamondback device. Following orbital
atherectomy, low pressure angioplasty was performed using a 4 x 80
mm IN.PACT drug coated balloon. The balloon was inflated to full
effacement and maintained in place for 2 minutes. The balloon was
then deflated and removed. Follow-up arteriography demonstrates
marked improvement with less than 20% residual stenosis.

The balloon and wire was removed. The 55 cm 6 French sheath was
exchanged for an 11 cm 6 French vascular sheath. Hemostasis was then
attained with the assistance of a Celt closure device.
IMPRESSION: 1. Successful orbital atherectomy and drug coated balloon
angioplasty of high-grade popliteal artery stenosis.
